# Patient Record
Sex: Female | Born: 1955
Health system: Southern US, Community
[De-identification: ages and names within clinical notes are randomized; demographics above are authoritative.]

## PROBLEM LIST (undated history)

## (undated) DIAGNOSIS — I10 Essential (primary) hypertension: Secondary | ICD-10-CM

## (undated) DIAGNOSIS — M722 Plantar fascial fibromatosis: Secondary | ICD-10-CM

## (undated) DIAGNOSIS — Z780 Asymptomatic menopausal state: Secondary | ICD-10-CM

## (undated) DIAGNOSIS — M810 Age-related osteoporosis without current pathological fracture: Secondary | ICD-10-CM

## (undated) DIAGNOSIS — E785 Hyperlipidemia, unspecified: Secondary | ICD-10-CM

## (undated) HISTORY — DX: Hyperlipidemia, unspecified: E78.5

## (undated) HISTORY — DX: Asymptomatic menopausal state: Z78.0

## (undated) HISTORY — DX: Age-related osteoporosis without current pathological fracture: M81.0

## (undated) HISTORY — PX: TUBAL LIGATION: SHX77

---

## 1994-10-19 HISTORY — PX: NASAL SEPTUM SURGERY: SHX37

## 2014-09-05 ENCOUNTER — Ambulatory Visit (INDEPENDENT_AMBULATORY_CARE_PROVIDER_SITE_OTHER): Payer: Federal, State, Local not specified - PPO | Admitting: Internal Medicine

## 2014-09-05 ENCOUNTER — Encounter: Payer: Self-pay | Admitting: Internal Medicine

## 2014-09-05 VITALS — BP 136/80 | HR 80 | Temp 98.4°F | Resp 16 | Ht 63.0 in | Wt 131.0 lb

## 2014-09-05 DIAGNOSIS — M810 Age-related osteoporosis without current pathological fracture: Secondary | ICD-10-CM | POA: Diagnosis not present

## 2014-09-05 DIAGNOSIS — IMO0002 Reserved for concepts with insufficient information to code with codable children: Secondary | ICD-10-CM | POA: Insufficient documentation

## 2014-09-05 DIAGNOSIS — E785 Hyperlipidemia, unspecified: Secondary | ICD-10-CM

## 2014-09-05 DIAGNOSIS — N941 Dyspareunia: Secondary | ICD-10-CM

## 2014-09-05 DIAGNOSIS — Z23 Encounter for immunization: Secondary | ICD-10-CM | POA: Diagnosis not present

## 2014-09-05 DIAGNOSIS — Z139 Encounter for screening, unspecified: Secondary | ICD-10-CM

## 2014-09-05 DIAGNOSIS — N951 Menopausal and female climacteric states: Secondary | ICD-10-CM | POA: Diagnosis not present

## 2014-09-05 NOTE — Progress Notes (Signed)
   Subjective:    Patient ID: Gloria Daugherty, female    DOB: 11-21-1955, 58 y.o.   MRN: [id number]  HPI  Gloria Daugherty is a new pt here for first visit.  In [place] for 2 years.  Former care in [place]  PMH of osteoporosis and menopause  She is concerned about vaginal dryness and painful intercourse due to dryness.  LMP 2008 not having vasomotor flushes now.      [facility]    Cholesterol  Minimally elevated.    Allergies  Allergen Reactions  . Codeine Other (See Comments)    Headache   Past Medical History  Diagnosis Date  . Osteoporosis   . Menopause    Past Surgical History  Procedure Laterality Date  . Tubal ligation    . Nasal septum surgery  1996   History   Social History  . Marital Status: Married    Spouse Name: N/A    Number of Children: N/A  . Years of Education: N/A   Occupational History  . Not on file.   Social History Main Topics  . Smoking status: Never Smoker   . Smokeless tobacco: Never Used  . Alcohol Use: 6.0 oz/week    10 Not specified per week  . Drug Use: No  . Sexual Activity:    Partners: Male   Other Topics Concern  . Not on file   Social History Narrative  . No narrative on file   Family History  Problem Relation Age of Onset  . Diabetes Mother   . Heart disease Father   . Diabetes Sister   . Diabetes Brother   . Heart disease Brother   . Diabetes Brother   . Heart disease Brother   . Diabetes Brother    Patient Active Problem List   Diagnosis Date Noted  . Osteoporosis 09/05/2014  . Vaginal dryness, menopausal 09/05/2014  . Dyspareunia 09/05/2014  . Hyperlipidemia 09/05/2014   No current outpatient prescriptions on file prior to visit.   No current facility-administered medications on file prior to visit.       Review of Systems See HPI    Objective:   Physical Exam Physical Exam  Nursing note and vitals reviewed.  Constitutional: She is oriented to person, place, and time. She appears well-developed and  well-nourished.  HENT:  Head: Normocephalic and atraumatic.  Cardiovascular: Normal rate and regular rhythm. Exam reveals no gallop and no friction rub.  No murmur heard.  Pulmonary/Chest: Breath sounds normal. She has no wheezes. She has no rales.  Neurological: She is alert and oriented to person, place, and time.  Skin: Skin is warm and dry.  Psychiatric: She has a normal mood and affect. Her behavior is normal.              Assessment & Plan:  Vaginal dryness/dyspaeurnia :  Counseled risk/benefit of HT she wishes to try non HT first  Samples Luvena given.   Given risk/benefit sheet   Hyperllipdemia  DASH diet   Osteoporosis   Will schedule DEXA  Will schedule mm dexa  Give Flu today

## 2014-09-05 NOTE — Patient Instructions (Signed)
Schedule CPE  Schedule mm and DEXA

## 2014-09-06 ENCOUNTER — Encounter: Payer: Self-pay | Admitting: *Deleted

## 2014-10-01 ENCOUNTER — Telehealth: Payer: Self-pay

## 2014-10-01 NOTE — Telephone Encounter (Signed)
Gloria CannyMargarita Daugherty [phone]  Gloria BellingMargarita called and she is coughing,congestion, sinus x 3 wks, she has been taking Clariton, nothing seems to help.

## 2014-10-01 NOTE — Telephone Encounter (Signed)
Offer her an appointment of to go to urgent care please

## 2014-10-02 ENCOUNTER — Ambulatory Visit (INDEPENDENT_AMBULATORY_CARE_PROVIDER_SITE_OTHER): Payer: Federal, State, Local not specified - PPO | Admitting: Internal Medicine

## 2014-10-02 ENCOUNTER — Encounter: Payer: Self-pay | Admitting: Internal Medicine

## 2014-10-02 VITALS — BP 131/81 | HR 70 | Temp 98.4°F | Resp 16 | Ht 63.0 in | Wt 130.0 lb

## 2014-10-02 DIAGNOSIS — J0100 Acute maxillary sinusitis, unspecified: Secondary | ICD-10-CM

## 2014-10-02 DIAGNOSIS — J029 Acute pharyngitis, unspecified: Secondary | ICD-10-CM

## 2014-10-02 DIAGNOSIS — R05 Cough: Secondary | ICD-10-CM | POA: Diagnosis not present

## 2014-10-02 DIAGNOSIS — R059 Cough, unspecified: Secondary | ICD-10-CM

## 2014-10-02 MED ORDER — BENZONATATE 100 MG PO CAPS: ORAL_CAPSULE | ORAL | Status: DC

## 2014-10-02 MED ORDER — AZITHROMYCIN 250 MG PO TABS: ORAL_TABLET | ORAL | Status: DC

## 2014-10-02 NOTE — Progress Notes (Signed)
Subjective:    Patient ID: Gloria CannyMargarita Dills, female    DOB: 1955/12/02, 58 y.o.   MRN: [id number]  HPI  11/18  Vaginal dryness/dyspaeurnia : Counseled risk/benefit of HT she wishes to try non HT first Samples Luvena given. Given risk/benefit sheet   Hyperllipdemia DASH diet   Osteoporosis Will schedule DEXA  Will schedule mm dexa Give Flu today      TODAY:  3 weeks cough white mucous  Began with sore throat .  No fever no chest pain no sob   Allergies  Allergen Reactions  . Codeine Other (See Comments)    Headache   Past Medical History  Diagnosis Date  . Osteoporosis   . Menopause    Past Surgical History  Procedure Laterality Date  . Tubal ligation    . Nasal septum surgery  1996   History   Social History  . Marital Status: Married    Spouse Name: N/A    Number of Children: N/A  . Years of Education: N/A   Occupational History  . Not on file.   Social History Main Topics  . Smoking status: Never Smoker   . Smokeless tobacco: Never Used  . Alcohol Use: 6.0 oz/week    10 Not specified per week  . Drug Use: No  . Sexual Activity:    Partners: Male   Other Topics Concern  . Not on file   Social History Narrative  . No narrative on file   Family History  Problem Relation Age of Onset  . Diabetes Mother   . Heart disease Father   . Diabetes Sister   . Diabetes Brother   . Heart disease Brother   . Diabetes Brother   . Heart disease Brother   . Diabetes Brother    Patient Active Problem List   Diagnosis Date Noted  . Osteoporosis 09/05/2014  . Vaginal dryness, menopausal 09/05/2014  . Dyspareunia 09/05/2014  . Hyperlipidemia 09/05/2014   Current Outpatient Prescriptions on File Prior to Visit  Medication Sig Dispense Refill  . Multiple Vitamin (MULTIVITAMIN) capsule Take 1 capsule by mouth daily.     No current facility-administered medications on file prior to visit.      Review of Systems See HPI    Objective:   Physical Exam Physical Exam  Constitutional: She is oriented to person, place, and time. She appears well-developed and well-nourished. She is cooperative.  HENT:  Head: Normocephalic and atraumatic.  Right Ear: A middle ear effusion is present.  Left Ear: A middle ear effusion is present.  Nose: Mucosal edema present. Right sinus exhibits maxillary sinus tenderness. Left sinus exhibits maxillary sinus tenderness.  Mouth/Throat: Posterior oropharyngeal erythema present.  Serous effusion bilaterally  Eyes: Conjunctivae and EOM are normal. Pupils are equal, round, and reactive to light.  Neck: Neck supple. Carotid bruit is not present. No mass present.  Cardiovascular: Regular rhythm, normal heart sounds, intact distal pulses and normal pulses. Exam reveals no gallop and no friction rub.  No murmur heard.  Pulmonary/Chest: Breath sounds normal. She has no wheezes. She has no rhonchi. She has no rales.  Neurological: She is alert and oriented to person, place, and time.  Skin: Skin is warm and dry. No abrasion, no bruising, no ecchymosis and no rash noted. No cyanosis. Nails show no clubbing.  Psychiatric: She has a normal mood and affect. Her speech is normal and behavior is normal.           Assessment & Plan:  Sinusitis   Z-pak  Pharyngitis see above   Cough tessalon perles q8h prn

## 2014-10-02 NOTE — Patient Instructions (Signed)
See me as needed 

## 2014-11-21 ENCOUNTER — Other Ambulatory Visit: Payer: Self-pay | Admitting: *Deleted

## 2014-11-21 ENCOUNTER — Other Ambulatory Visit: Payer: Self-pay | Admitting: Internal Medicine

## 2014-11-21 DIAGNOSIS — Z0000 Encounter for general adult medical examination without abnormal findings: Secondary | ICD-10-CM

## 2014-11-21 DIAGNOSIS — Z139 Encounter for screening, unspecified: Secondary | ICD-10-CM

## 2014-11-21 DIAGNOSIS — M810 Age-related osteoporosis without current pathological fracture: Secondary | ICD-10-CM

## 2014-11-27 ENCOUNTER — Ambulatory Visit: Payer: Self-pay | Admitting: Internal Medicine

## 2014-12-04 ENCOUNTER — Ambulatory Visit: Payer: Self-pay

## 2014-12-04 ENCOUNTER — Other Ambulatory Visit: Payer: Self-pay

## 2014-12-05 ENCOUNTER — Other Ambulatory Visit: Payer: Self-pay | Admitting: *Deleted

## 2014-12-05 DIAGNOSIS — Z0000 Encounter for general adult medical examination without abnormal findings: Secondary | ICD-10-CM

## 2014-12-11 LAB — TSH: TSH: 2.407 u[IU]/mL (ref 0.350–4.500)

## 2014-12-11 LAB — CBC WITH DIFFERENTIAL/PLATELET
Basophils Absolute: 0 10*3/uL (ref 0.0–0.1)
Basophils Relative: 1 % (ref 0–1)
Eosinophils Absolute: 0.1 10*3/uL (ref 0.0–0.7)
Eosinophils Relative: 2 % (ref 0–5)
HCT: 37.7 % (ref 36.0–46.0)
Hemoglobin: 12.4 g/dL (ref 12.0–15.0)
Lymphocytes Relative: 48 % — ABNORMAL HIGH (ref 12–46)
Lymphs Abs: 1.6 10*3/uL (ref 0.7–4.0)
MCH: 30.5 pg (ref 26.0–34.0)
MCHC: 32.9 g/dL (ref 30.0–36.0)
MCV: 92.6 fL (ref 78.0–100.0)
MPV: 10.3 fL (ref 8.6–12.4)
Monocytes Absolute: 0.2 10*3/uL (ref 0.1–1.0)
Monocytes Relative: 5 % (ref 3–12)
Neutro Abs: 1.5 10*3/uL — ABNORMAL LOW (ref 1.7–7.7)
Neutrophils Relative %: 44 % (ref 43–77)
Platelets: 222 10*3/uL (ref 150–400)
RBC: 4.07 MIL/uL (ref 3.87–5.11)
RDW: 14.1 % (ref 11.5–15.5)
WBC: 3.3 10*3/uL — ABNORMAL LOW (ref 4.0–10.5)

## 2014-12-11 LAB — COMPLETE METABOLIC PANEL WITH GFR
ALT: 15 U/L (ref 0–35)
AST: 19 U/L (ref 0–37)
Albumin: 4.3 g/dL (ref 3.5–5.2)
Alkaline Phosphatase: 61 U/L (ref 39–117)
BUN: 12 mg/dL (ref 6–23)
CO2: 27 mEq/L (ref 19–32)
Calcium: 9.5 mg/dL (ref 8.4–10.5)
Chloride: 106 mEq/L (ref 96–112)
Creat: 0.61 mg/dL (ref 0.50–1.10)
GFR, Est African American: >89 mL/min
GFR, Est Non African American: >89 mL/min
Glucose, Bld: 91 mg/dL (ref 70–99)
Potassium: 4 mEq/L (ref 3.5–5.3)
Sodium: 142 mEq/L (ref 135–145)
Total Bilirubin: 0.5 mg/dL (ref 0.2–1.2)
Total Protein: 6.3 g/dL (ref 6.0–8.3)

## 2014-12-11 LAB — LIPID PANEL
Cholesterol: 214 mg/dL — ABNORMAL HIGH (ref 0–200)
HDL: 37 mg/dL — ABNORMAL LOW (ref >=46)
LDL Cholesterol: 155 mg/dL — ABNORMAL HIGH (ref 0–99)
Total CHOL/HDL Ratio: 5.8 Ratio
Triglycerides: 110 mg/dL (ref <150)
VLDL: 22 mg/dL (ref 0–40)

## 2014-12-11 LAB — VITAMIN D 25 HYDROXY (VIT D DEFICIENCY, FRACTURES): Vit D, 25-Hydroxy: 45 ng/mL (ref 30–100)

## 2014-12-13 ENCOUNTER — Ambulatory Visit: Payer: Self-pay | Admitting: Internal Medicine

## 2014-12-14 ENCOUNTER — Ambulatory Visit: Admission: RE | Admit: 2014-12-14 | Payer: Federal, State, Local not specified - PPO | Source: Ambulatory Visit

## 2014-12-14 ENCOUNTER — Ambulatory Visit
Admission: RE | Admit: 2014-12-14 | Discharge: 2014-12-14 | Disposition: A | Discharge status: Home / Self Care | Payer: Federal, State, Local not specified - PPO | Source: Ambulatory Visit

## 2014-12-14 ENCOUNTER — Other Ambulatory Visit: Payer: Self-pay

## 2014-12-14 ENCOUNTER — Ambulatory Visit
Admission: RE | Admit: 2014-12-14 | Discharge: 2014-12-14 | Disposition: A | Discharge status: Home / Self Care | Payer: Federal, State, Local not specified - PPO | Source: Ambulatory Visit | Attending: Internal Medicine | Admitting: Internal Medicine

## 2014-12-14 DIAGNOSIS — Z1231 Encounter for screening mammogram for malignant neoplasm of breast: Secondary | ICD-10-CM

## 2014-12-14 DIAGNOSIS — M810 Age-related osteoporosis without current pathological fracture: Secondary | ICD-10-CM

## 2014-12-17 ENCOUNTER — Ambulatory Visit (INDEPENDENT_AMBULATORY_CARE_PROVIDER_SITE_OTHER): Payer: Federal, State, Local not specified - PPO | Admitting: Internal Medicine

## 2014-12-17 ENCOUNTER — Encounter: Payer: Self-pay | Admitting: *Deleted

## 2014-12-17 ENCOUNTER — Encounter: Payer: Self-pay | Admitting: Internal Medicine

## 2014-12-17 ENCOUNTER — Telehealth: Payer: Self-pay | Admitting: Internal Medicine

## 2014-12-17 ENCOUNTER — Telehealth: Payer: Self-pay | Admitting: *Deleted

## 2014-12-17 VITALS — BP 148/83 | HR 75 | Resp 16 | Ht 63.0 in | Wt 132.0 lb

## 2014-12-17 DIAGNOSIS — Z124 Encounter for screening for malignant neoplasm of cervix: Secondary | ICD-10-CM | POA: Diagnosis not present

## 2014-12-17 DIAGNOSIS — H9319 Tinnitus, unspecified ear: Secondary | ICD-10-CM

## 2014-12-17 DIAGNOSIS — Z0000 Encounter for general adult medical examination without abnormal findings: Secondary | ICD-10-CM

## 2014-12-17 DIAGNOSIS — Z1211 Encounter for screening for malignant neoplasm of colon: Secondary | ICD-10-CM | POA: Diagnosis not present

## 2014-12-17 DIAGNOSIS — E785 Hyperlipidemia, unspecified: Secondary | ICD-10-CM

## 2014-12-17 DIAGNOSIS — Z1151 Encounter for screening for human papillomavirus (HPV): Secondary | ICD-10-CM

## 2014-12-17 DIAGNOSIS — N951 Menopausal and female climacteric states: Secondary | ICD-10-CM | POA: Diagnosis not present

## 2014-12-17 DIAGNOSIS — M8580 Other specified disorders of bone density and structure, unspecified site: Secondary | ICD-10-CM | POA: Diagnosis not present

## 2014-12-17 LAB — POCT URINALYSIS DIPSTICK
Bilirubin, UA: NEGATIVE
Blood, UA: NEGATIVE
Glucose, UA: NEGATIVE
Ketones, UA: NEGATIVE
Leukocytes, UA: NEGATIVE
Nitrite, UA: NEGATIVE
Protein, UA: NEGATIVE
Spec Grav, UA: 1.015
Urobilinogen, UA: NEGATIVE
pH, UA: 6.5

## 2014-12-17 LAB — HEMOCCULT GUIAC POC 1CARD (OFFICE)
Card #1 Date: 2292016
Fecal Occult Blood, POC: NEGATIVE

## 2014-12-17 NOTE — Telephone Encounter (Signed)
Call pt with appt to [facility] for tinnitus    Order already in chart

## 2014-12-17 NOTE — Progress Notes (Signed)
Subjective:    Patient ID: Gloria Daugherty, female    DOB: [birth year], 59 y.o.   MRN: [id number]  HPI 08/2014 note Assessment & Plan:  Vaginal dryness/dyspaeurnia : Counseled risk/benefit of HT she wishes to try non HT first Samples Luvena given. Given risk/benefit sheet   Hyperllipdemia DASH diet   Osteoporosis Will schedule DEXA  Will schedule mm dexa Give Flu today        TODAY  Gloria Daugherty is here for CPE  HM needs pap  Today,  Dexa and mm done.   Osteoporosis  Pt reports was on Actonel for 2 years while living in [place].  She stopped med herself.    Situational depression  Gloria Daugherty is tearful today as nephew in [place] who she was very close to completed suicide  in December.  Hyperlipidemia:  See labs  [name] repeatedly states she does not want medication.  She wants to try fish oil and DASH diet and exercise.  Not eating well since news of nephew's suicide.       Allergies  Allergen Reactions  . Codeine Other (See Comments)    Headache   Past Medical History  Diagnosis Date  . Osteoporosis   . Menopause    Past Surgical History  Procedure Laterality Date  . Tubal ligation    . Nasal septum surgery  1996   History   Social History  . Marital Status: Married    Spouse Name: N/A  . Number of Children: N/A  . Years of Education: N/A   Occupational History  . Not on file.   Social History Main Topics  . Smoking status: Never Smoker   . Smokeless tobacco: Never Used  . Alcohol Use: 6.0 oz/week    10 Standard drinks or equivalent per week  . Drug Use: No  . Sexual Activity:    Partners: Male    Birth Control/ Protection: Post-menopausal   Other Topics Concern  . Not on file   Social History Narrative   Family History  Problem Relation Age of Onset  . Diabetes Mother   . Heart disease Father   . Diabetes Sister   . Diabetes Brother   . Heart disease Brother   . Diabetes Brother   . Heart disease Brother   . Diabetes Brother     Patient Active Problem List   Diagnosis Date Noted  . Osteoporosis 09/05/2014  . Vaginal dryness, menopausal 09/05/2014  . Dyspareunia 09/05/2014  . Hyperlipidemia 09/05/2014   Current Outpatient Prescriptions on File Prior to Visit  Medication Sig Dispense Refill  . azithromycin (ZITHROMAX) 250 MG tablet Take as directed 6 tablet 0  . benzonatate (TESSALON) 100 MG capsule Take one q8h prn cough 20 capsule 1  . loratadine (CLARITIN) 10 MG tablet Take 10 mg by mouth daily.    . Multiple Vitamin (MULTIVITAMIN) capsule Take 1 capsule by mouth daily.    . Pseudoephedrine-Naproxen Na (ALEVE-D SINUS & COLD) 120-220 MG TB12 Take by mouth.     No current facility-administered medications on file prior to visit.       Review of Systems  Respiratory: Negative for cough, chest tightness, shortness of breath and wheezing.   Cardiovascular: Negative for chest pain, palpitations and leg swelling.  All other systems reviewed and are negative.      Objective:   Physical Exam  Physical Exam  Vital signs and nursing note reviewed  Constitutional: She is oriented to person, place, and time. She appears well-developed and well-nourished. She  is cooperative.  HENT:  Head: Normocephalic and atraumatic.  Right Ear: Tympanic membrane normal.  Left Ear: Tympanic membrane normal.  Nose: Nose normal.  Mouth/Throat: Oropharynx is clear and moist and mucous membranes are normal. No oropharyngeal exudate or posterior oropharyngeal erythema.  Eyes: Conjunctivae and EOM are normal. Pupils are equal, round, and reactive to light.  Neck: Neck supple. No JVD present. Carotid bruit is not present. No mass and no thyromegaly present.  Cardiovascular: Regular rhythm, normal heart sounds, intact distal pulses and normal pulses.  Exam reveals no gallop and no friction rub.   No murmur heard. Pulses:      Dorsalis pedis pulses are 2+ on the right side, and 2+ on the left side.  Pulmonary/Chest: Breath  sounds normal. She has no wheezes. She has no rhonchi. She has no rales. Right breast exhibits no mass, no nipple discharge and no skin change. Left breast exhibits no mass, no nipple discharge and no skin change.  Abdominal: Soft. Bowel sounds are normal. She exhibits no distension and no mass. There is no hepatosplenomegaly. There is no tenderness. There is no CVA tenderness.  Genitourinary: Rectum normal, vagina normal and uterus normal. Rectal exam shows no mass. Guaiac negative stool. No labial fusion. There is no lesion on the right labia. There is no lesion on the left labia. Cervix exhibits no motion tenderness. Right adnexum displays no mass, no tenderness and no fullness. Left adnexum displays no mass, no tenderness and no fullness. No erythema around the vagina.  Musculoskeletal:       No active synovitis to any joint.    Lymphadenopathy:       Right cervical: No superficial cervical adenopathy present.      Left cervical: No superficial cervical adenopathy present.       Right axillary: No pectoral and no lateral adenopathy present.       Left axillary: No pectoral and no lateral adenopathy present.      Right: No inguinal adenopathy present.       Left: No inguinal adenopathy present.  Neurological: She is alert and oriented to person, place, and time. She has normal strength and normal reflexes. No cranial nerve deficit or sensory deficit. She displays a negative Romberg sign. Coordination and gait normal.  Skin: Skin is warm and dry. No abrasion, no bruising, no ecchymosis and no rash noted. No cyanosis. Nails show no clubbing.  Psychiatric: She has a normal mood and affect. Her speech is normal and behavior is normal.          Assessment & Plan:   HM: pap today  She is a non-smoker  Declines vaccines due to insurance  Hyperlipidemia  She declines RX meds  Wishes to try DASH diet and fish oil  Tinnitus  Will refer to ENT  Osteopenia  caclicum and vitamin D  Grief : pt  does not wish therapist now.  States she is slowly getting better    See me as needed      Assessment & Plan:

## 2014-12-17 NOTE — Telephone Encounter (Signed)
Patient has an appointment with [facility] on 12/23/14 @ 10, patient is aware-eh

## 2014-12-17 NOTE — Telephone Encounter (Signed)
[facility] and gave her the number for the support group she could contact through [facility]. The number for the [employer] is [phone]. -eh

## 2014-12-18 LAB — CYTOLOGY - PAP

## 2014-12-31 ENCOUNTER — Encounter: Payer: Self-pay | Admitting: *Deleted

## 2014-12-31 ENCOUNTER — Telehealth: Payer: Self-pay | Admitting: Internal Medicine

## 2014-12-31 NOTE — Telephone Encounter (Signed)
Gloria BellingMargarita is aware of her DEXA scan results. She was advised on her Calcium and Vitamin D dosing -eh

## 2014-12-31 NOTE — Telephone Encounter (Signed)
Call pt and let her know that I have received her bone density results.  She does not have osteoporosis,  She has early bone thinning  (osteopenia). Advise her to take calcium 1200-1600 mg and vitamin D [phone] units daily   Thanks

## 2015-03-11 ENCOUNTER — Ambulatory Visit: Payer: Federal, State, Local not specified - PPO | Admitting: Internal Medicine

## 2016-07-15 ENCOUNTER — Other Ambulatory Visit: Payer: Self-pay | Admitting: Internal Medicine

## 2016-07-15 DIAGNOSIS — Z1231 Encounter for screening mammogram for malignant neoplasm of breast: Secondary | ICD-10-CM

## 2016-07-24 ENCOUNTER — Ambulatory Visit: Payer: Federal, State, Local not specified - PPO

## 2016-07-31 ENCOUNTER — Ambulatory Visit
Admission: RE | Admit: 2016-07-31 | Discharge: 2016-07-31 | Disposition: A | Discharge status: Home / Self Care | Payer: Federal, State, Local not specified - PPO | Source: Ambulatory Visit | Attending: Internal Medicine | Admitting: Internal Medicine

## 2016-07-31 DIAGNOSIS — Z1231 Encounter for screening mammogram for malignant neoplasm of breast: Secondary | ICD-10-CM

## 2017-07-12 DIAGNOSIS — Z23 Encounter for immunization: Secondary | ICD-10-CM | POA: Diagnosis not present

## 2017-07-12 DIAGNOSIS — J301 Allergic rhinitis due to pollen: Secondary | ICD-10-CM | POA: Diagnosis not present

## 2017-07-12 DIAGNOSIS — E7800 Pure hypercholesterolemia, unspecified: Secondary | ICD-10-CM | POA: Diagnosis not present

## 2017-07-12 DIAGNOSIS — Z0000 Encounter for general adult medical examination without abnormal findings: Secondary | ICD-10-CM | POA: Diagnosis not present

## 2017-07-12 DIAGNOSIS — M8588 Other specified disorders of bone density and structure, other site: Secondary | ICD-10-CM | POA: Diagnosis not present

## 2017-07-19 ENCOUNTER — Other Ambulatory Visit: Payer: Self-pay | Admitting: Internal Medicine

## 2017-07-19 DIAGNOSIS — Z1231 Encounter for screening mammogram for malignant neoplasm of breast: Secondary | ICD-10-CM

## 2017-08-02 ENCOUNTER — Ambulatory Visit: Payer: Federal, State, Local not specified - PPO

## 2017-08-13 ENCOUNTER — Ambulatory Visit
Admission: RE | Admit: 2017-08-13 | Discharge: 2017-08-13 | Disposition: A | Discharge status: Home / Self Care | Payer: Federal, State, Local not specified - PPO | Source: Ambulatory Visit | Attending: Internal Medicine | Admitting: Internal Medicine

## 2017-08-13 DIAGNOSIS — Z1231 Encounter for screening mammogram for malignant neoplasm of breast: Secondary | ICD-10-CM | POA: Diagnosis not present

## 2017-12-02 ENCOUNTER — Ambulatory Visit
Admission: RE | Admit: 2017-12-02 | Discharge: 2017-12-02 | Disposition: A | Discharge status: Home / Self Care | Payer: Federal, State, Local not specified - PPO | Source: Ambulatory Visit | Attending: Internal Medicine | Admitting: Internal Medicine

## 2017-12-02 ENCOUNTER — Other Ambulatory Visit: Payer: Self-pay | Admitting: Internal Medicine

## 2017-12-02 DIAGNOSIS — M79671 Pain in right foot: Secondary | ICD-10-CM

## 2017-12-16 ENCOUNTER — Ambulatory Visit: Payer: Self-pay | Admitting: Podiatry

## 2017-12-16 DIAGNOSIS — Z01818 Encounter for other preprocedural examination: Secondary | ICD-10-CM | POA: Diagnosis not present

## 2017-12-16 DIAGNOSIS — Z1211 Encounter for screening for malignant neoplasm of colon: Secondary | ICD-10-CM | POA: Diagnosis not present

## 2017-12-21 ENCOUNTER — Telehealth: Payer: Self-pay | Admitting: Sports Medicine

## 2017-12-21 ENCOUNTER — Ambulatory Visit: Payer: Federal, State, Local not specified - PPO

## 2017-12-21 ENCOUNTER — Encounter: Payer: Self-pay | Admitting: Sports Medicine

## 2017-12-21 ENCOUNTER — Ambulatory Visit: Payer: Federal, State, Local not specified - PPO | Admitting: Sports Medicine

## 2017-12-21 VITALS — BP 138/91 | HR 77 | Resp 16

## 2017-12-21 DIAGNOSIS — M722 Plantar fascial fibromatosis: Secondary | ICD-10-CM

## 2017-12-21 DIAGNOSIS — M79671 Pain in right foot: Secondary | ICD-10-CM | POA: Diagnosis not present

## 2017-12-21 MED ORDER — DICLOFENAC SODIUM 75 MG PO TBEC: 75.0000 mg | DELAYED_RELEASE_TABLET | Freq: Two times a day (BID) | ORAL | 0 refills | Status: DC

## 2017-12-21 NOTE — Patient Instructions (Signed)

## 2017-12-21 NOTE — Progress Notes (Signed)
   Subjective:    Patient ID: Gloria CannyMargarita Fors, female    DOB: [birth year], 62 y.o.   MRN: [id number]  HPI    Review of Systems  All other systems reviewed and are negative.      Objective:   Physical Exam        Assessment & Plan:

## 2017-12-21 NOTE — Progress Notes (Signed)
Subjective: Gloria Daugherty is a 62 y.o. female patient presents to office with complaint of mild discomforting heel pain on the right. Patient admits to post static dyskinesia for 2 months in duration. Patient has treated this problem with gentle stretching and rest at end of day with no relief. Reports pain is not bad or bothersome during day. At most dull ache 2/10 at end of day. Denies recent injury but admits to sting ray injury 15 years ago. Was told by PCP had heel spur and had xrays done. Denies any other pedal complaints.   Review of Systems  Musculoskeletal:       Right heel pain  All other systems reviewed and are negative.   Patient Active Problem List   Diagnosis Date Noted  . Osteopenia  Dexa done 2016  12/17/2014  . Osteoporosis 09/05/2014  . Vaginal dryness, menopausal 09/05/2014  . Dyspareunia 09/05/2014  . Hyperlipidemia 09/05/2014    Current Outpatient Medications on File Prior to Visit  Medication Sig Dispense Refill  . CALCIUM PO Take 1 tablet by mouth daily.    . Cholecalciferol (VITAMIN D PO) Take 1 tablet by mouth daily.    Marland Kitchen loratadine (CLARITIN) 10 MG tablet Take 10 mg by mouth daily.    . Multiple Vitamin (MULTIVITAMIN) capsule Take 1 capsule by mouth daily. Centrum    . Omega-3 Fatty Acids (FISH OIL PO) Take 1 tablet by mouth daily.     No current facility-administered medications on file prior to visit.     Allergies  Allergen Reactions  . Codeine Other (See Comments)    Headache    Objective: Physical Exam General: The patient is alert and oriented x3 in no acute distress.  Dermatology: Skin is warm, dry and supple bilateral lower extremities. Nails 1-10 are normal. There is no erythema, edema, no eccymosis, no open lesions present. Integument is otherwise unremarkable.  Vascular: Dorsalis Pedis pulse and Posterior Tibial pulse are 2/4 bilateral. Capillary fill time is immediate to all digits.  Neurological: Grossly intact to light touch with  an achilles reflex of +2/5 and a  negative Tinel's sign bilateral.  Musculoskeletal: Tenderness to palpation at the medial calcaneal tubercale and through the insertion of the plantar fascia on the right foot. No pain with compression of calcaneus bilateral. No pain with tuning fork to calcaneus bilateral. No pain with calf compression bilateral. There is decreased Ankle joint range of motion bilateral. All other joints range of motion within normal limits bilateral. Strength 5/5 in all groups bilateral.   Xrays 12-02-17 reviewed consistent with heel spur, no other acute findings.    Assessment and Plan: Problem List Items Addressed This Visit    None    Visit Diagnoses    Plantar fasciitis of right foot    -  Primary   Pain of right heel          -Complete examination performed.  -Xrays reviewed -Discussed with patient in detail the condition of plantar fasciitis, how this occurs and general treatment options. Explained both conservative and surgical treatments.  -No injection because pain is minor  -Rx Diclofenac  -Recommended good supportive shoes and advised use of OTC insert. Explained to patient that if these orthoses work well, we will continue with these. If these do not improveher condition and  pain, we will consider custom molded orthoses. - Explained in detail the use of the fascial brace for right which was dispensed at today's visit. -Explained and dispensed to patient daily stretching exercises. -  Recommend patient to ice affected area 1-2x daily. -Patient to return to office in 4 weeks for follow up or sooner if problems or questions arise.  Asencion Islamitorya Jameon Deller, DPM

## 2017-12-22 NOTE — Telephone Encounter (Signed)
I saw Dr. Marylene LandStover today and she diagnosed me with plantar fasciitis. She prescribed me an antiinflammatory and I wanted to know if I can consume some alcohol with it? The instructions say to talk to your doctor before consuming alcohol. I just want to know if I can. My call back number is [phone]. Thank you.

## 2017-12-22 NOTE — Telephone Encounter (Signed)
I informed pt Gloria Daugherty stated it was not a good idea to have alcohol with any medication, but diclofenac could cause sleepiness and on occasion stomach up set, both of which were effects alcohol could increase. Pt states understanding.

## 2018-01-02 ENCOUNTER — Other Ambulatory Visit: Payer: Self-pay | Admitting: Sports Medicine

## 2018-01-11 DIAGNOSIS — E7800 Pure hypercholesterolemia, unspecified: Secondary | ICD-10-CM | POA: Diagnosis not present

## 2018-01-25 ENCOUNTER — Ambulatory Visit: Payer: Federal, State, Local not specified - PPO | Admitting: Sports Medicine

## 2018-01-25 ENCOUNTER — Encounter: Payer: Self-pay | Admitting: Sports Medicine

## 2018-01-25 DIAGNOSIS — M722 Plantar fascial fibromatosis: Secondary | ICD-10-CM

## 2018-01-25 DIAGNOSIS — M79671 Pain in right foot: Secondary | ICD-10-CM

## 2018-01-25 MED ORDER — TRIAMCINOLONE ACETONIDE 10 MG/ML IJ SUSP: 10.0000 mg | Freq: Once | INTRAMUSCULAR | Status: AC

## 2018-01-25 NOTE — Progress Notes (Signed)
Subjective: Gloria CannyMargarita Daugherty is a 62 y.o. female returns to office for follow up evaluation right heel pain. Reports that it feels slightly better with brace but wants injection to see if she can get more relief. Patient denies any recent changes in medications or new problems since last visit.   Patient Active Problem List   Diagnosis Date Noted  . Osteopenia  Dexa done 2016  12/17/2014  . Osteoporosis 09/05/2014  . Vaginal dryness, menopausal 09/05/2014  . Dyspareunia 09/05/2014  . Hyperlipidemia 09/05/2014    Current Outpatient Medications on File Prior to Visit  Medication Sig Dispense Refill  . CALCIUM PO Take 1 tablet by mouth daily.    . Cholecalciferol (VITAMIN D PO) Take 1 tablet by mouth daily.    . diclofenac (VOLTAREN) 75 MG EC tablet TAKE 1 TABLET BY MOUTH TWICE DAILY 30 tablet 0  . loratadine (CLARITIN) 10 MG tablet Take 10 mg by mouth daily.    . Multiple Vitamin (MULTIVITAMIN) capsule Take 1 capsule by mouth daily. Centrum    . Omega-3 Fatty Acids (FISH OIL PO) Take 1 tablet by mouth daily.     No current facility-administered medications on file prior to visit.     Allergies  Allergen Reactions  . Codeine Other (See Comments)    Headache    Objective:   General:  Alert and oriented x 3, in no acute distress  Dermatology: Skin is warm, dry, and supple bilateral. Nails are within normal limits. There is no lower extremity erythema, no eccymosis, no open lesions present bilateral.   Vascular: Dorsalis Pedis and Posterior Tibial pedal pulses are 2/4 bilateral. + hair growth noted bilateral. Capillary Fill Time is 3 seconds in all digits. No varicosities, No edema bilateral lower extremities.   Neurological: Sensation grossly intact to light touch with an achilles reflex of +2 and a negative Tinel's sign bilateral. Vibratory, sharp/dull, Semmes Weinstein Monofilament within normal limits.   Musculoskeletal: There is mild tenderness to palpation at the medial  calcaneal tubercale and through the insertion of the plantar fascia on the right foot. No pain with compression to calcaneus or application of tuning fork. There is decreased Ankle joint range of motion bilateral. All other joints range of motion  within normal limits bilateral. Strength 5/5 bilateral.   Assessment and Plan: Problem List Items Addressed This Visit    None    Visit Diagnoses    Plantar fasciitis of right foot    -  Primary   Relevant Medications   triamcinolone acetonide (KENALOG) 10 MG/ML injection 10 mg (Start on 01/25/2018  2:45 PM)   Pain of right heel       Relevant Medications   triamcinolone acetonide (KENALOG) 10 MG/ML injection 10 mg (Start on 01/25/2018  2:45 PM)      -Complete examination performed.  -Previous x-rays reviewed. -Discussed with patient in detail the condition of plantar fasciitis, how this  occurs related to the foot type of the patient and general treatment options. - Patient opted fo injection today; After oral consent and aseptic prep, injected a mixture containing 1 ml of 1%plain lidocaine, 1 ml 0.5% plain marcaine, 0.5 ml of kenalog 10 and 0.5 ml of dexmethasone phosphate to right heel at area of most pain/trigger point injection. -Continue with stretching, icing, good supportive shoes, inserts daily. -Discussed long term care and reocurrence; will closely monitor; if fails to improve will consider other treatment modalities.  -Patient to return to office in 4-6 weeks for follow up or  sooner if problems or questions arise.  Landis Martins, DPM

## 2018-03-08 ENCOUNTER — Ambulatory Visit: Payer: Federal, State, Local not specified - PPO | Admitting: Sports Medicine

## 2018-03-22 DIAGNOSIS — R197 Diarrhea, unspecified: Secondary | ICD-10-CM | POA: Diagnosis not present

## 2018-03-25 ENCOUNTER — Other Ambulatory Visit: Payer: Self-pay | Admitting: Internal Medicine

## 2018-03-25 DIAGNOSIS — G44099 Other trigeminal autonomic cephalgias (TAC), not intractable: Secondary | ICD-10-CM

## 2018-03-25 DIAGNOSIS — H547 Unspecified visual loss: Secondary | ICD-10-CM | POA: Diagnosis not present

## 2018-03-25 DIAGNOSIS — R197 Diarrhea, unspecified: Secondary | ICD-10-CM | POA: Diagnosis not present

## 2018-03-25 DIAGNOSIS — G4489 Other headache syndrome: Secondary | ICD-10-CM | POA: Diagnosis not present

## 2018-03-25 DIAGNOSIS — Z8249 Family history of ischemic heart disease and other diseases of the circulatory system: Secondary | ICD-10-CM | POA: Diagnosis not present

## 2018-04-05 ENCOUNTER — Ambulatory Visit
Admission: RE | Admit: 2018-04-05 | Discharge: 2018-04-05 | Disposition: A | Discharge status: Home / Self Care | Payer: Federal, State, Local not specified - PPO | Source: Ambulatory Visit | Attending: Internal Medicine | Admitting: Internal Medicine

## 2018-04-05 ENCOUNTER — Inpatient Hospital Stay: Admission: RE | Admit: 2018-04-05 | Payer: Federal, State, Local not specified - PPO | Source: Ambulatory Visit

## 2018-04-05 DIAGNOSIS — H547 Unspecified visual loss: Secondary | ICD-10-CM

## 2018-04-05 DIAGNOSIS — Z8249 Family history of ischemic heart disease and other diseases of the circulatory system: Secondary | ICD-10-CM

## 2018-04-05 DIAGNOSIS — R51 Headache: Secondary | ICD-10-CM | POA: Diagnosis not present

## 2018-04-05 DIAGNOSIS — G44099 Other trigeminal autonomic cephalgias (TAC), not intractable: Secondary | ICD-10-CM

## 2018-04-05 MED ORDER — IOPAMIDOL (ISOVUE-370) INJECTION 76%
75.0000 mL | Freq: Once | INTRAVENOUS | Status: AC | PRN
  Administered 2018-04-05: 75 mL via INTRAVENOUS

## 2018-04-06 DIAGNOSIS — A09 Infectious gastroenteritis and colitis, unspecified: Secondary | ICD-10-CM | POA: Diagnosis not present

## 2018-04-06 DIAGNOSIS — H547 Unspecified visual loss: Secondary | ICD-10-CM | POA: Diagnosis not present

## 2018-05-13 ENCOUNTER — Encounter (HOSPITAL_COMMUNITY): Payer: Self-pay | Admitting: Emergency Medicine

## 2018-05-13 ENCOUNTER — Other Ambulatory Visit: Payer: Self-pay

## 2018-05-13 ENCOUNTER — Ambulatory Visit (HOSPITAL_COMMUNITY)
Admission: EM | Admit: 2018-05-13 | Discharge: 2018-05-13 | Disposition: A | Discharge status: Home / Self Care | Payer: Federal, State, Local not specified - PPO | Attending: Family Medicine | Admitting: Family Medicine

## 2018-05-13 DIAGNOSIS — J069 Acute upper respiratory infection, unspecified: Secondary | ICD-10-CM | POA: Diagnosis not present

## 2018-05-13 HISTORY — DX: Plantar fascial fibromatosis: M72.2

## 2018-05-13 MED ORDER — BENZONATATE 100 MG PO CAPS: 100.0000 mg | ORAL_CAPSULE | Freq: Three times a day (TID) | ORAL | 0 refills | Status: DC

## 2018-05-13 MED ORDER — FLUTICASONE PROPIONATE 50 MCG/ACT NA SUSP: 2.0000 | Freq: Every day | NASAL | 2 refills | Status: AC

## 2018-05-13 MED ORDER — CETIRIZINE-PSEUDOEPHEDRINE ER 5-120 MG PO TB12: 1.0000 | ORAL_TABLET | Freq: Every day | ORAL | 0 refills | Status: DC

## 2018-05-13 NOTE — ED Provider Notes (Signed)
[facility]    CSN: [id number] Arrival date & time: 05/13/18  1049     History   Chief Complaint Chief Complaint  Patient presents with  . Nasal Congestion    HPI Gloria Daugherty is a 62 y.o. female.   Patient is a healthy 62 year old female that presents with 6 days of nasal congestion, cough, body aches.  She reports that the symptoms have been constant but not worsening.  The symptoms started after returning from [place].  The worst symptom is the cough and the inability to sleep at night.  She has a lot of congestion in her nasal area with slight facial tenderness.  Denies any ear pain, sore throat, fever.  She denies any chest pain, shortness of breath, calf pain.  No history of PE, DVT.  She  does not smoke.   ROS per HPI      Past Medical History:  Diagnosis Date  . Menopause   . Osteoporosis   . Plantar fasciitis     Patient Active Problem List   Diagnosis Date Noted  . Osteopenia  Dexa done 2016  12/17/2014  . Osteoporosis 09/05/2014  . Vaginal dryness, menopausal 09/05/2014  . Dyspareunia 09/05/2014  . Hyperlipidemia 09/05/2014    Past Surgical History:  Procedure Laterality Date  . NASAL SEPTUM SURGERY  1996  . TUBAL LIGATION      OB History   None      Home Medications    Prior to Admission medications   Medication Sig Start Date End Date Taking? Authorizing Provider  CALCIUM PO Take 1 tablet by mouth daily.   Yes [provider]  Cholecalciferol (VITAMIN D PO) Take 1 tablet by mouth daily.   Yes [provider]  Multiple Vitamin (MULTIVITAMIN) capsule Take 1 capsule by mouth daily. Centrum   Yes [provider]  Omega-3 Fatty Acids (FISH OIL PO) Take 1 tablet by mouth daily.   Yes [provider]  benzonatate (TESSALON) 100 MG capsule Take 1 capsule (100 mg total) by mouth every 8 (eight) hours. 05/13/18   Rhodes Calvert, Gloris Manchester A, NP  cetirizine-pseudoephedrine (ZYRTEC-D) 5-120 MG tablet Take 1 tablet by  mouth daily. 05/13/18   Dahlia Byes A, NP  diclofenac (VOLTAREN) 75 MG EC tablet TAKE 1 TABLET BY MOUTH TWICE DAILY 01/03/18   Asencion Islam, DPM  fluticasone (FLONASE) 50 MCG/ACT nasal spray Place 2 sprays into both nostrils daily. 05/13/18   Dahlia Byes A, NP  loratadine (CLARITIN) 10 MG tablet Take 10 mg by mouth daily.    [provider]    Family History Family History  Problem Relation Age of Onset  . Diabetes Mother   . Heart disease Father   . Diabetes Sister   . Diabetes Brother   . Heart disease Brother   . Diabetes Brother   . Heart disease Brother   . Diabetes Brother     Social History Social History   Tobacco Use  . Smoking status: Never Smoker  . Smokeless tobacco: Never Used  Substance Use Topics  . Alcohol use: Yes    Alcohol/week: 6.0 oz    Types: 10 Standard drinks or equivalent per week  . Drug use: No     Allergies   Codeine   Review of Systems Review of Systems   Physical Exam Triage Vital Signs ED Triage Vitals  Enc Vitals Group     BP 05/13/18 1131 (!) 160/98     Pulse Rate 05/13/18 1131 80  Resp 05/13/18 1131 18     Temp 05/13/18 1131 98.3 F (36.8 C)     Temp Source 05/13/18 1131 Oral     SpO2 05/13/18 1131 100 %     Weight 05/13/18 1144 134 lb (60.8 kg)     Height 05/13/18 1144 5\' 3"  (1.6 m)     Head Circumference --      Peak Flow --      Pain Score 05/13/18 1143 0     Pain Loc --      Pain Edu? --      Excl. in GC? --    No data found.  Updated Vital Signs BP (!) 160/98 (BP Location: Right Arm)   Pulse 80   Temp 98.3 F (36.8 C) (Oral)   Resp 18   Ht 5\' 3"  (1.6 m)   Wt 134 lb (60.8 kg)   SpO2 100%   BMI 23.74 kg/m   Visual Acuity Right Eye Distance:   Left Eye Distance:   Bilateral Distance:    Right Eye Near:   Left Eye Near:    Bilateral Near:     Physical Exam  Constitutional: She is oriented to person, place, and time. She appears well-developed and well-nourished.  HENT:  Head:  Normocephalic and atraumatic.  Right Ear: External ear normal.  Left Ear: External ear normal.  Nose: Nose normal.  Mouth/Throat: Oropharynx is clear and moist. No oropharyngeal exudate.  Swollen right nasal turbinate.  Mild tenderness to palpation of frontal and maxillary sinuses.  Bilateral TMs normal.   Eyes: Conjunctivae are normal.  Neck: Normal range of motion.  Cardiovascular: Normal rate and regular rhythm.  Pulmonary/Chest: Effort normal and breath sounds normal.  No adventitious breath sounds.  Lymphadenopathy:    She has no cervical adenopathy.  Neurological: She is alert and oriented to person, place, and time.  Skin: Skin is warm and dry. Capillary refill takes less than 2 seconds.  Psychiatric: She has a normal mood and affect.  Nursing note and vitals reviewed.    UC Treatments / Results  Labs (all labs ordered are listed, but only abnormal results are displayed) Labs Reviewed - No data to display  EKG None  Radiology No results found.  Procedures Procedures (including critical care time)  Medications Ordered in UC Medications - No data to display  Initial Impression / Assessment and Plan / UC Course  I have reviewed the triage vital signs and the nursing notes.  Pertinent labs & imaging results that were available during my care of the patient were reviewed by me and considered in my medical decision making (see chart for details).    Most likely viral upper respiratory infection.  No concern for PE, DVT.  Will give symptomatic treatment for her symptoms.  No antibiotic coverage needed at this time.  Told that if her symptoms do not improve in the next week to please follow back up.  Patient agreeable to plan.  Final Clinical Impressions(s) / UC Diagnoses   Final diagnoses:  Upper respiratory tract infection, unspecified type     Discharge Instructions     It was nice meeting you!!  I believe you have a viral upper respiratory infection. It could  take 7 to 10 days for the symptoms to decrease.  We will treat your  cough with Tessalon Perles. Zyrtec D for congestion and Flonase for allergy symptoms.  Make sure you are staying hydrated and monitor for worsening symptoms. Warm tea with honey can  also help with the cough.  Follow-up with your PCP if not getting any better.    ED Prescriptions    Medication Sig Dispense Auth. Provider   cetirizine-pseudoephedrine (ZYRTEC-D) 5-120 MG tablet Take 1 tablet by mouth daily. 30 tablet Jestina Stephani A, NP   fluticasone (FLONASE) 50 MCG/ACT nasal spray Place 2 sprays into both nostrils daily. 16 g Candita Borenstein A, NP   benzonatate (TESSALON) 100 MG capsule Take 1 capsule (100 mg total) by mouth every 8 (eight) hours. 21 capsule Dahlia ByesBast, Dequavion Follette A, NP     Controlled Substance Prescriptions [place] Controlled Substance Registry consulted? Not Applicable   Janace ArisBast, Ely Ballen A, NP 05/13/18 1231

## 2018-05-13 NOTE — Discharge Instructions (Addendum)
It was nice meeting you!!  I believe you have a viral upper respiratory infection. It could take 7 to 10 days for the symptoms to decrease.  We will treat your  cough with Tessalon Perles. Zyrtec D for congestion and Flonase for allergy symptoms.  Make sure you are staying hydrated and monitor for worsening symptoms. Warm tea with honey can also help with the cough.  Follow-up with your PCP if not getting any better.

## 2018-05-13 NOTE — ED Triage Notes (Signed)
Patient states she has congestion and cough, sinus drainage x 6 days

## 2018-06-27 DIAGNOSIS — K648 Other hemorrhoids: Secondary | ICD-10-CM | POA: Diagnosis not present

## 2018-06-27 DIAGNOSIS — K5730 Diverticulosis of large intestine without perforation or abscess without bleeding: Secondary | ICD-10-CM | POA: Diagnosis not present

## 2018-06-27 DIAGNOSIS — Z1211 Encounter for screening for malignant neoplasm of colon: Secondary | ICD-10-CM | POA: Diagnosis not present

## 2018-08-01 DIAGNOSIS — Z23 Encounter for immunization: Secondary | ICD-10-CM | POA: Diagnosis not present

## 2018-08-01 DIAGNOSIS — M8588 Other specified disorders of bone density and structure, other site: Secondary | ICD-10-CM | POA: Diagnosis not present

## 2018-08-01 DIAGNOSIS — E7800 Pure hypercholesterolemia, unspecified: Secondary | ICD-10-CM | POA: Diagnosis not present

## 2018-08-01 DIAGNOSIS — J301 Allergic rhinitis due to pollen: Secondary | ICD-10-CM | POA: Diagnosis not present

## 2018-08-01 DIAGNOSIS — Z0000 Encounter for general adult medical examination without abnormal findings: Secondary | ICD-10-CM | POA: Diagnosis not present

## 2018-09-06 ENCOUNTER — Other Ambulatory Visit: Payer: Self-pay | Admitting: Internal Medicine

## 2018-09-06 DIAGNOSIS — Z1231 Encounter for screening mammogram for malignant neoplasm of breast: Secondary | ICD-10-CM

## 2018-10-04 ENCOUNTER — Other Ambulatory Visit (HOSPITAL_COMMUNITY)
Admission: RE | Admit: 2018-10-04 | Discharge: 2018-10-04 | Disposition: A | Discharge status: Home / Self Care | Payer: Federal, State, Local not specified - PPO | Source: Ambulatory Visit | Attending: Internal Medicine | Admitting: Internal Medicine

## 2018-10-04 ENCOUNTER — Other Ambulatory Visit: Payer: Self-pay | Admitting: Internal Medicine

## 2018-10-04 DIAGNOSIS — Z01411 Encounter for gynecological examination (general) (routine) with abnormal findings: Secondary | ICD-10-CM | POA: Diagnosis not present

## 2018-10-04 DIAGNOSIS — Z01419 Encounter for gynecological examination (general) (routine) without abnormal findings: Secondary | ICD-10-CM | POA: Diagnosis not present

## 2018-10-04 DIAGNOSIS — Z23 Encounter for immunization: Secondary | ICD-10-CM | POA: Diagnosis not present

## 2018-10-07 LAB — CYTOLOGY - PAP
Diagnosis: NEGATIVE
HPV: NOT DETECTED

## 2018-10-21 ENCOUNTER — Ambulatory Visit
Admission: RE | Admit: 2018-10-21 | Discharge: 2018-10-21 | Disposition: A | Discharge status: Home / Self Care | Payer: Federal, State, Local not specified - PPO | Source: Ambulatory Visit | Attending: Internal Medicine | Admitting: Internal Medicine

## 2018-10-21 DIAGNOSIS — Z1231 Encounter for screening mammogram for malignant neoplasm of breast: Secondary | ICD-10-CM | POA: Diagnosis not present

## 2019-05-31 DIAGNOSIS — R42 Dizziness and giddiness: Secondary | ICD-10-CM | POA: Diagnosis not present

## 2019-05-31 DIAGNOSIS — E7800 Pure hypercholesterolemia, unspecified: Secondary | ICD-10-CM | POA: Diagnosis not present

## 2019-05-31 DIAGNOSIS — R030 Elevated blood-pressure reading, without diagnosis of hypertension: Secondary | ICD-10-CM | POA: Diagnosis not present

## 2019-06-06 DIAGNOSIS — I10 Essential (primary) hypertension: Secondary | ICD-10-CM | POA: Diagnosis not present

## 2019-06-06 DIAGNOSIS — E7800 Pure hypercholesterolemia, unspecified: Secondary | ICD-10-CM | POA: Diagnosis not present

## 2019-07-13 ENCOUNTER — Emergency Department (HOSPITAL_BASED_OUTPATIENT_CLINIC_OR_DEPARTMENT_OTHER): Payer: Federal, State, Local not specified - PPO

## 2019-07-13 ENCOUNTER — Other Ambulatory Visit: Payer: Self-pay

## 2019-07-13 ENCOUNTER — Emergency Department (HOSPITAL_BASED_OUTPATIENT_CLINIC_OR_DEPARTMENT_OTHER)
Admission: EM | Admit: 2019-07-13 | Discharge: 2019-07-13 | Disposition: A | Discharge status: Home / Self Care | Payer: Federal, State, Local not specified - PPO | Attending: Emergency Medicine | Admitting: Emergency Medicine

## 2019-07-13 ENCOUNTER — Encounter (HOSPITAL_BASED_OUTPATIENT_CLINIC_OR_DEPARTMENT_OTHER): Payer: Self-pay | Admitting: *Deleted

## 2019-07-13 DIAGNOSIS — R1032 Left lower quadrant pain: Secondary | ICD-10-CM | POA: Diagnosis not present

## 2019-07-13 DIAGNOSIS — R109 Unspecified abdominal pain: Secondary | ICD-10-CM | POA: Diagnosis not present

## 2019-07-13 DIAGNOSIS — Z79899 Other long term (current) drug therapy: Secondary | ICD-10-CM | POA: Diagnosis not present

## 2019-07-13 DIAGNOSIS — D259 Leiomyoma of uterus, unspecified: Secondary | ICD-10-CM | POA: Diagnosis not present

## 2019-07-13 DIAGNOSIS — K5792 Diverticulitis of intestine, part unspecified, without perforation or abscess without bleeding: Secondary | ICD-10-CM | POA: Diagnosis not present

## 2019-07-13 DIAGNOSIS — I10 Essential (primary) hypertension: Secondary | ICD-10-CM | POA: Insufficient documentation

## 2019-07-13 DIAGNOSIS — R319 Hematuria, unspecified: Secondary | ICD-10-CM | POA: Diagnosis not present

## 2019-07-13 HISTORY — DX: Essential (primary) hypertension: I10

## 2019-07-13 LAB — CBC WITH DIFFERENTIAL/PLATELET
Abs Immature Granulocytes: 0.03 10*3/uL (ref 0.00–0.07)
Basophils Absolute: 0 10*3/uL (ref 0.0–0.1)
Basophils Relative: 0 %
Eosinophils Absolute: 0 10*3/uL (ref 0.0–0.5)
Eosinophils Relative: 0 %
HCT: 38.6 % (ref 36.0–46.0)
Hemoglobin: 12.8 g/dL (ref 12.0–15.0)
Immature Granulocytes: 0 %
Lymphocytes Relative: 20 %
Lymphs Abs: 2.2 10*3/uL (ref 0.7–4.0)
MCH: 31.3 pg (ref 26.0–34.0)
MCHC: 33.2 g/dL (ref 30.0–36.0)
MCV: 94.4 fL (ref 80.0–100.0)
Monocytes Absolute: 0.7 10*3/uL (ref 0.1–1.0)
Monocytes Relative: 7 %
Neutro Abs: 7.9 10*3/uL — ABNORMAL HIGH (ref 1.7–7.7)
Neutrophils Relative %: 73 %
Platelets: 188 10*3/uL (ref 150–400)
RBC: 4.09 MIL/uL (ref 3.87–5.11)
RDW: 13 % (ref 11.5–15.5)
WBC: 10.9 10*3/uL — ABNORMAL HIGH (ref 4.0–10.5)
nRBC: 0 % (ref 0.0–0.2)

## 2019-07-13 LAB — URINALYSIS, ROUTINE W REFLEX MICROSCOPIC
Bilirubin Urine: NEGATIVE
Glucose, UA: NEGATIVE mg/dL
Ketones, ur: 15 mg/dL — AB
Nitrite: NEGATIVE
Protein, ur: NEGATIVE mg/dL
Specific Gravity, Urine: <1.005 — ABNORMAL LOW (ref 1.005–1.030)
pH: 7 (ref 5.0–8.0)

## 2019-07-13 LAB — COMPREHENSIVE METABOLIC PANEL
ALT: 18 U/L (ref 0–44)
AST: 18 U/L (ref 15–41)
Albumin: 4.4 g/dL (ref 3.5–5.0)
Alkaline Phosphatase: 63 U/L (ref 38–126)
Anion gap: 11 (ref 5–15)
BUN: 8 mg/dL (ref 8–23)
CO2: 23 mmol/L (ref 22–32)
Calcium: 9.2 mg/dL (ref 8.9–10.3)
Chloride: 101 mmol/L (ref 98–111)
Creatinine, Ser: 0.67 mg/dL (ref 0.44–1.00)
GFR calc Af Amer: >60 mL/min (ref >60)
GFR calc non Af Amer: >60 mL/min (ref >60)
Glucose, Bld: 110 mg/dL — ABNORMAL HIGH (ref 70–99)
Potassium: 3.5 mmol/L (ref 3.5–5.1)
Sodium: 135 mmol/L (ref 135–145)
Total Bilirubin: 0.7 mg/dL (ref 0.3–1.2)
Total Protein: 7.5 g/dL (ref 6.5–8.1)

## 2019-07-13 LAB — URINALYSIS, MICROSCOPIC (REFLEX)

## 2019-07-13 LAB — LIPASE, BLOOD: Lipase: 19 U/L (ref 11–51)

## 2019-07-13 MED ORDER — AMOXICILLIN-POT CLAVULANATE 875-125 MG PO TABS: 1.0000 | ORAL_TABLET | Freq: Three times a day (TID) | ORAL | 0 refills | Status: AC

## 2019-07-13 NOTE — ED Triage Notes (Signed)
Pt c/o mid abd pain x 2 days , denies n/v/d sent here from [place] office for eval

## 2019-07-13 NOTE — Discharge Instructions (Addendum)
Ms. Gritz,  You presented to the ED with left sided abdominal pain found to have diverticulitis. Please take antibiotics as prescribed. Pain control with tylenol as needed. Please follow up with your PCP.  Incidental finding of fibroid uterus on CT scan. Please follow up with your PCP and OB/GYN for further management. If your pain is worsened or you develop fever, please seek emergent medical care.  Thank you!

## 2019-07-13 NOTE — ED Provider Notes (Signed)
[facility] EMERGENCY DEPARTMENT Provider Note   CSN: [id number] Arrival date & time: 07/13/19  1808     History   Chief Complaint Chief Complaint  Patient presents with   Abdominal Pain    HPI Gloria Daugherty is a 63 y.o. female with Hx of HTN and HTN presenting with two days of lower abdominal pain. Patient reports that yesterday after lunch, she started having pain across her lower abdomen that has since continued into today. Today she reports that the pain is more localized to the left lower quadrant with occasional radiation across the abdomen. She reports nausea but no vomiting. She denies any fevers, chills, diarrhea, chest pain or shortness of breath.    Past Medical History:  Diagnosis Date   Hypertension    Menopause    Osteoporosis    Plantar fasciitis     Patient Active Problem List   Diagnosis Date Noted   Osteopenia  Dexa done 2016  12/17/2014   Osteoporosis 09/05/2014   Vaginal dryness, menopausal 09/05/2014   Dyspareunia 09/05/2014   Hyperlipidemia 09/05/2014    Past Surgical History:  Procedure Laterality Date   NASAL SEPTUM SURGERY  1996   TUBAL LIGATION       OB History   No obstetric history on file.      Home Medications    Prior to Admission medications   Medication Sig Start Date End Date Taking? Authorizing Provider  lisinopril (ZESTRIL) 10 MG tablet Take 10 mg by mouth daily.   Yes [provider]  rosuvastatin (CRESTOR) 20 MG tablet Take 20 mg by mouth daily.   Yes [provider]  amoxicillin-clavulanate (AUGMENTIN) 875-125 MG tablet Take 1 tablet by mouth every 8 (eight) hours for 7 days. 07/13/19 07/20/19  Eliezer BottomAslam, Dalin Caldera, MD  benzonatate (TESSALON) 100 MG capsule Take 1 capsule (100 mg total) by mouth every 8 (eight) hours. 05/13/18   Dahlia ByesBast, Traci A, NP  CALCIUM PO Take 1 tablet by mouth daily.    [provider]  cetirizine-pseudoephedrine (ZYRTEC-D) 5-120 MG tablet Take 1 tablet by  mouth daily. 05/13/18   Dahlia ByesBast, Traci A, NP  Cholecalciferol (VITAMIN D PO) Take 1 tablet by mouth daily.    [provider]  diclofenac (VOLTAREN) 75 MG EC tablet TAKE 1 TABLET BY MOUTH TWICE DAILY 01/03/18   Asencion IslamStover, Titorya, DPM  fluticasone (FLONASE) 50 MCG/ACT nasal spray Place 2 sprays into both nostrils daily. 05/13/18   Dahlia ByesBast, Traci A, NP  loratadine (CLARITIN) 10 MG tablet Take 10 mg by mouth daily.    [provider]  Multiple Vitamin (MULTIVITAMIN) capsule Take 1 capsule by mouth daily. Centrum    [provider]  Omega-3 Fatty Acids (FISH OIL PO) Take 1 tablet by mouth daily.    [provider]    Family History Family History  Problem Relation Age of Onset   Diabetes Mother    Heart disease Father    Diabetes Sister    Diabetes Brother    Heart disease Brother    Diabetes Brother    Heart disease Brother    Diabetes Brother    Breast cancer Neg Hx     Social History Social History   Tobacco Use   Smoking status: Never Smoker   Smokeless tobacco: Never Used  Substance Use Topics   Alcohol use: Yes    Alcohol/week: 10.0 standard drinks    Types: 10 Standard drinks or equivalent per week   Drug use: No  Allergies   Codeine   Review of Systems Review of Systems  Constitutional: Positive for appetite change. Negative for chills and fever.  HENT: Negative.   Eyes: Negative.   Respiratory: Negative for cough, shortness of breath and wheezing.   Cardiovascular: Negative for chest pain and palpitations.  Gastrointestinal: Positive for abdominal pain. Negative for abdominal distention, blood in stool, constipation, diarrhea, nausea and vomiting.  Endocrine: Negative.   Genitourinary: Negative for decreased urine volume, dysuria, flank pain, frequency, pelvic pain, urgency, vaginal bleeding and vaginal discharge.  Musculoskeletal: Negative.   Skin: Negative.   Neurological: Negative for dizziness, speech difficulty,  weakness, numbness and headaches.  All other systems reviewed and are negative.    Physical Exam Updated Vital Signs BP 130/80 (BP Location: Right Arm)    Pulse 84    Temp 99 F (37.2 C) (Oral)    Resp 18    Ht 5\' 2"  (1.575 m)    Wt 59.9 kg    SpO2 98%    BMI 24.14 kg/m   Physical Exam Constitutional:      General: She is not in acute distress.    Appearance: She is well-developed. She is not diaphoretic.  HENT:     Mouth/Throat:     Mouth: Mucous membranes are moist.     Pharynx: Oropharynx is clear. No pharyngeal swelling or oropharyngeal exudate.  Eyes:     General: No scleral icterus.    Extraocular Movements: Extraocular movements intact.     Pupils: Pupils are equal, round, and reactive to light.  Cardiovascular:     Rate and Rhythm: Normal rate and regular rhythm.     Heart sounds: Normal heart sounds.  Pulmonary:     Effort: Pulmonary effort is normal. No respiratory distress.     Breath sounds: Normal breath sounds. No wheezing.  Abdominal:     General: Abdomen is flat. Bowel sounds are increased. There is no distension.     Palpations: Abdomen is soft.     Tenderness: There is abdominal tenderness in the suprapubic area and left lower quadrant.  Skin:    General: Skin is warm and dry.     Capillary Refill: Capillary refill takes less than 2 seconds.  Neurological:     General: No focal deficit present.     Mental Status: She is alert and oriented to person, place, and time.     Motor: No weakness.     ED Treatments / Results  Labs (all labs ordered are listed, but only abnormal results are displayed) Labs Reviewed  URINALYSIS, ROUTINE W REFLEX MICROSCOPIC - Abnormal; Notable for the following components:      Result Value   Specific Gravity, Urine <1.005 (*)    Hgb urine dipstick LARGE (*)    Ketones, ur 15 (*)    Leukocytes,Ua TRACE (*)    All other components within normal limits  CBC WITH DIFFERENTIAL/PLATELET - Abnormal; Notable for the following  components:   WBC 10.9 (*)    Neutro Abs 7.9 (*)    All other components within normal limits  COMPREHENSIVE METABOLIC PANEL - Abnormal; Notable for the following components:   Glucose, Bld 110 (*)    All other components within normal limits  URINALYSIS, MICROSCOPIC (REFLEX) - Abnormal; Notable for the following components:   Bacteria, UA RARE (*)    All other components within normal limits  LIPASE, BLOOD    EKG None  Radiology Ct Renal Stone Study  Result Date: 07/13/2019 CLINICAL DATA:  Initial evaluation for acute mid abdominal pain for 2 days. EXAM: CT ABDOMEN AND PELVIS WITHOUT CONTRAST TECHNIQUE: Multidetector CT imaging of the abdomen and pelvis was performed following the standard protocol without IV contrast. COMPARISON:  None. FINDINGS: Lower chest: Visualized lung bases are clear. Hepatobiliary: 9 mm hypodensity noted within the central aspect of the right hepatic lobe, too small the characterize, but could reflect a small cyst. Limited noncontrast evaluation of the liver is otherwise unremarkable. Layering hyperdensity within the gallbladder lumen suggestive of sludge. No frank cholelithiasis. No findings to suggest acute cholecystitis. No biliary dilatation. Pancreas: Mild hazy inflammatory stranding surrounds the pancreatic tail related to the acute inflammatory process about the adjacent colon. Pancreas otherwise unremarkable. Spleen: Spleen within normal limits. Adrenals/Urinary Tract: Adrenal glands are normal. Kidneys equal in size without evidence for nephrolithiasis or hydronephrosis. No radiopaque calculi seen along the course of either renal collecting system. No hydroureter. Partially distended bladder within normal limits. No layering stones within the bladder lumen. Stomach/Bowel: Stomach within normal limits. No evidence for bowel obstruction. Normal appendix. Abnormal wall thickening with hazy inflammatory stranding seen about the colon at the level of the splenic  flexure, consistent with acute diverticulitis. No evidence for perforation or other complication. Additional scattered noninflamed colonic diverticula seen elsewhere within the descending and sigmoid colon. No other acute inflammatory changes seen about the bowels. Vascular/Lymphatic: Intra-abdominal aorta of normal caliber. No adenopathy. Reproductive: Partially calcified fibroid noted within the uterus. Uterus and ovaries otherwise unremarkable. Other: No free air or fluid. Small fat containing paraumbilical hernia noted without associated inflammation. Musculoskeletal: No acute osseous finding. No discrete lytic or blastic osseous lesions. Moderate degenerative spondylolysis noted at L4-5 and L5-S1. IMPRESSION: 1. Findings consistent with acute diverticulitis at the level of the splenic flexure. No evidence for perforation or other complication. 2. No other acute intra-abdominal or pelvic process identified. 3. No CT evidence for nephrolithiasis or obstructive uropathy. 4. Fibroid uterus. Electronically Signed   By: Rise Mu M.D.   On: 07/13/2019 21:40    Procedures Procedures (including critical care time)  Medications Ordered in ED Medications - No data to display   Initial Impression / Assessment and Plan / ED Course  I have reviewed the triage vital signs and the nursing notes.  Pertinent labs & imaging results that were available during my care of the patient were reviewed by me and considered in my medical decision making (see chart for details).  Patient is a 63yo female with history of HTN and HLD presenting with two days of lower abdominal pain and nausea. She reports that the pain started yesterday after lunch and was across her entire lower abdomen. She reports that the pain has since localized to the left lower quadrant but still radiates across the entire lower abdomen. She has associated nausea but denies any vomiting. As a result of the abdominal pain, patient has had  decreased appetite. She had a normal bowel movement yesterday and today. Patient denies any change in bowel color or bloody stools. She denies any urinary symptoms or flank pain.   Patient is afebrile and hemodynamically stable. She does not appear to be in acute distress. She has hyperactive bowel sounds and there is tenderness to palpation of the left lower quadrant and suprapubic area. Slight leukocytosis on labs and UA with hematuria. Ddx of renal stones, diverticulitis, ovarian pathology. CT abdomen consistent with diverticulitis at splenic flexure without complications and fibroid uterus. No renal stone noted.   Patient discharged to home  with Augmentin with instructions to follow up with PCP. Also recommended for follow up with OB/GYN for further management of her fibroid uterus. Strict return precautions given. Patient and husband at bedside express understanding and agree with the discharge plan.   Final Clinical Impressions(s) / ED Diagnoses   Final diagnoses:  Diverticulitis    ED Discharge Orders         Ordered    amoxicillin-clavulanate (AUGMENTIN) 875-125 MG tablet  Every 8 hours     07/13/19 2200           Eliezer Bottom, MD 07/14/19 1258    Melene Plan, DO 07/14/19 1502

## 2019-08-04 DIAGNOSIS — Z23 Encounter for immunization: Secondary | ICD-10-CM | POA: Diagnosis not present

## 2019-08-04 DIAGNOSIS — Z0000 Encounter for general adult medical examination without abnormal findings: Secondary | ICD-10-CM | POA: Diagnosis not present

## 2019-08-04 DIAGNOSIS — I10 Essential (primary) hypertension: Secondary | ICD-10-CM | POA: Diagnosis not present

## 2019-08-04 DIAGNOSIS — Z8249 Family history of ischemic heart disease and other diseases of the circulatory system: Secondary | ICD-10-CM | POA: Diagnosis not present

## 2019-08-04 DIAGNOSIS — E7800 Pure hypercholesterolemia, unspecified: Secondary | ICD-10-CM | POA: Diagnosis not present

## 2019-10-26 ENCOUNTER — Other Ambulatory Visit: Payer: Self-pay | Admitting: Internal Medicine

## 2019-10-26 DIAGNOSIS — Z1231 Encounter for screening mammogram for malignant neoplasm of breast: Secondary | ICD-10-CM

## 2019-12-04 ENCOUNTER — Ambulatory Visit: Payer: Federal, State, Local not specified - PPO

## 2019-12-06 ENCOUNTER — Ambulatory Visit
Admission: RE | Admit: 2019-12-06 | Discharge: 2019-12-06 | Disposition: A | Discharge status: Home / Self Care | Payer: Federal, State, Local not specified - PPO | Source: Ambulatory Visit | Attending: Internal Medicine | Admitting: Internal Medicine

## 2019-12-06 ENCOUNTER — Other Ambulatory Visit: Payer: Self-pay

## 2019-12-06 DIAGNOSIS — Z1231 Encounter for screening mammogram for malignant neoplasm of breast: Secondary | ICD-10-CM

## 2019-12-20 DIAGNOSIS — G44209 Tension-type headache, unspecified, not intractable: Secondary | ICD-10-CM | POA: Diagnosis not present

## 2020-01-31 DIAGNOSIS — R635 Abnormal weight gain: Secondary | ICD-10-CM | POA: Diagnosis not present

## 2020-01-31 DIAGNOSIS — E7800 Pure hypercholesterolemia, unspecified: Secondary | ICD-10-CM | POA: Diagnosis not present

## 2020-01-31 DIAGNOSIS — I10 Essential (primary) hypertension: Secondary | ICD-10-CM | POA: Diagnosis not present

## 2020-05-10 DIAGNOSIS — K5792 Diverticulitis of intestine, part unspecified, without perforation or abscess without bleeding: Secondary | ICD-10-CM | POA: Diagnosis not present

## 2020-06-18 DIAGNOSIS — S60811A Abrasion of right wrist, initial encounter: Secondary | ICD-10-CM | POA: Diagnosis not present

## 2020-06-18 DIAGNOSIS — L089 Local infection of the skin and subcutaneous tissue, unspecified: Secondary | ICD-10-CM | POA: Diagnosis not present

## 2020-08-12 DIAGNOSIS — Z23 Encounter for immunization: Secondary | ICD-10-CM | POA: Diagnosis not present

## 2020-08-12 DIAGNOSIS — Z0000 Encounter for general adult medical examination without abnormal findings: Secondary | ICD-10-CM | POA: Diagnosis not present

## 2020-08-12 DIAGNOSIS — E7800 Pure hypercholesterolemia, unspecified: Secondary | ICD-10-CM | POA: Diagnosis not present

## 2020-08-17 ENCOUNTER — Ambulatory Visit: Payer: Federal, State, Local not specified - PPO | Attending: Internal Medicine

## 2020-08-17 DIAGNOSIS — Z23 Encounter for immunization: Secondary | ICD-10-CM

## 2020-08-17 NOTE — Progress Notes (Signed)
   Covid-19 Vaccination Clinic  Name:  Gloria Daugherty    MRN: [id number] DOB: [birth year]  08/17/2020  [name] was observed post Covid-19 immunization for 15 minutes without incident. She was provided with Vaccine Information Sheet and instruction to access the V-Safe system.   [name] was instructed to call 911 with any severe reactions post vaccine: Marland Kitchen Difficulty breathing  . Swelling of face and throat  . A fast heartbeat  . A bad rash all over body  . Dizziness and weakness

## 2020-09-06 ENCOUNTER — Other Ambulatory Visit: Payer: Self-pay | Admitting: Internal Medicine

## 2020-09-06 DIAGNOSIS — M85859 Other specified disorders of bone density and structure, unspecified thigh: Secondary | ICD-10-CM

## 2020-11-05 ENCOUNTER — Other Ambulatory Visit: Payer: Self-pay | Admitting: Internal Medicine

## 2020-11-05 DIAGNOSIS — Z1231 Encounter for screening mammogram for malignant neoplasm of breast: Secondary | ICD-10-CM

## 2020-11-12 DIAGNOSIS — R053 Chronic cough: Secondary | ICD-10-CM | POA: Diagnosis not present

## 2020-11-12 DIAGNOSIS — I10 Essential (primary) hypertension: Secondary | ICD-10-CM | POA: Diagnosis not present

## 2020-11-13 ENCOUNTER — Other Ambulatory Visit: Payer: Self-pay | Admitting: Internal Medicine

## 2020-11-13 DIAGNOSIS — E2839 Other primary ovarian failure: Secondary | ICD-10-CM

## 2020-11-13 DIAGNOSIS — M8580 Other specified disorders of bone density and structure, unspecified site: Secondary | ICD-10-CM

## 2020-11-14 ENCOUNTER — Other Ambulatory Visit: Payer: Self-pay | Admitting: Internal Medicine

## 2020-11-14 DIAGNOSIS — M8580 Other specified disorders of bone density and structure, unspecified site: Secondary | ICD-10-CM

## 2020-11-14 DIAGNOSIS — E2839 Other primary ovarian failure: Secondary | ICD-10-CM

## 2020-11-21 DIAGNOSIS — J069 Acute upper respiratory infection, unspecified: Secondary | ICD-10-CM | POA: Diagnosis not present

## 2020-12-09 ENCOUNTER — Other Ambulatory Visit: Payer: Self-pay

## 2020-12-09 ENCOUNTER — Ambulatory Visit
Admission: RE | Admit: 2020-12-09 | Discharge: 2020-12-09 | Disposition: A | Discharge status: Home / Self Care | Payer: Federal, State, Local not specified - PPO | Source: Ambulatory Visit | Attending: Internal Medicine | Admitting: Internal Medicine

## 2020-12-09 DIAGNOSIS — Z1231 Encounter for screening mammogram for malignant neoplasm of breast: Secondary | ICD-10-CM | POA: Diagnosis not present

## 2020-12-12 DIAGNOSIS — I10 Essential (primary) hypertension: Secondary | ICD-10-CM | POA: Diagnosis not present

## 2021-01-06 DIAGNOSIS — Z23 Encounter for immunization: Secondary | ICD-10-CM | POA: Diagnosis not present

## 2021-02-19 DIAGNOSIS — Z01419 Encounter for gynecological examination (general) (routine) without abnormal findings: Secondary | ICD-10-CM | POA: Diagnosis not present

## 2021-02-26 ENCOUNTER — Other Ambulatory Visit: Payer: Self-pay

## 2021-02-26 ENCOUNTER — Emergency Department (HOSPITAL_BASED_OUTPATIENT_CLINIC_OR_DEPARTMENT_OTHER)
Admission: EM | Admit: 2021-02-26 | Discharge: 2021-02-26 | Disposition: A | Discharge status: Home / Self Care | Payer: Federal, State, Local not specified - PPO | Attending: Emergency Medicine | Admitting: Emergency Medicine

## 2021-02-26 ENCOUNTER — Emergency Department (HOSPITAL_BASED_OUTPATIENT_CLINIC_OR_DEPARTMENT_OTHER): Payer: Federal, State, Local not specified - PPO

## 2021-02-26 DIAGNOSIS — Z79899 Other long term (current) drug therapy: Secondary | ICD-10-CM | POA: Diagnosis not present

## 2021-02-26 DIAGNOSIS — R55 Syncope and collapse: Secondary | ICD-10-CM | POA: Diagnosis not present

## 2021-02-26 DIAGNOSIS — Z20822 Contact with and (suspected) exposure to covid-19: Secondary | ICD-10-CM | POA: Diagnosis not present

## 2021-02-26 DIAGNOSIS — R42 Dizziness and giddiness: Secondary | ICD-10-CM | POA: Diagnosis not present

## 2021-02-26 DIAGNOSIS — R531 Weakness: Secondary | ICD-10-CM | POA: Diagnosis not present

## 2021-02-26 DIAGNOSIS — R5383 Other fatigue: Secondary | ICD-10-CM | POA: Insufficient documentation

## 2021-02-26 DIAGNOSIS — R519 Headache, unspecified: Secondary | ICD-10-CM | POA: Diagnosis not present

## 2021-02-26 DIAGNOSIS — I10 Essential (primary) hypertension: Secondary | ICD-10-CM | POA: Diagnosis not present

## 2021-02-26 LAB — CBC WITH DIFFERENTIAL/PLATELET
Abs Immature Granulocytes: 0 10*3/uL (ref 0.00–0.07)
Basophils Absolute: 0 10*3/uL (ref 0.0–0.1)
Basophils Relative: 1 %
Eosinophils Absolute: 0.2 10*3/uL (ref 0.0–0.5)
Eosinophils Relative: 4 %
HCT: 41 % (ref 36.0–46.0)
Hemoglobin: 13.7 g/dL (ref 12.0–15.0)
Immature Granulocytes: 0 %
Lymphocytes Relative: 54 %
Lymphs Abs: 2.5 10*3/uL (ref 0.7–4.0)
MCH: 31.4 pg (ref 26.0–34.0)
MCHC: 33.4 g/dL (ref 30.0–36.0)
MCV: 93.8 fL (ref 80.0–100.0)
Monocytes Absolute: 0.3 10*3/uL (ref 0.1–1.0)
Monocytes Relative: 7 %
Neutro Abs: 1.6 10*3/uL — ABNORMAL LOW (ref 1.7–7.7)
Neutrophils Relative %: 34 %
Platelets: 206 10*3/uL (ref 150–400)
RBC: 4.37 MIL/uL (ref 3.87–5.11)
RDW: 13.1 % (ref 11.5–15.5)
WBC: 4.6 10*3/uL (ref 4.0–10.5)
nRBC: 0 % (ref 0.0–0.2)

## 2021-02-26 LAB — RESP PANEL BY RT-PCR (FLU A&B, COVID) ARPGX2
Influenza A by PCR: NEGATIVE
Influenza B by PCR: NEGATIVE
SARS Coronavirus 2 by RT PCR: NEGATIVE

## 2021-02-26 LAB — COMPREHENSIVE METABOLIC PANEL
ALT: 21 U/L (ref 0–44)
AST: 19 U/L (ref 15–41)
Albumin: 4.5 g/dL (ref 3.5–5.0)
Alkaline Phosphatase: 72 U/L (ref 38–126)
Anion gap: 8 (ref 5–15)
BUN: 14 mg/dL (ref 8–23)
CO2: 26 mmol/L (ref 22–32)
Calcium: 9 mg/dL (ref 8.9–10.3)
Chloride: 105 mmol/L (ref 98–111)
Creatinine, Ser: 0.53 mg/dL (ref 0.44–1.00)
GFR, Estimated: >60 mL/min (ref >60)
Glucose, Bld: 97 mg/dL (ref 70–99)
Potassium: 3.8 mmol/L (ref 3.5–5.1)
Sodium: 139 mmol/L (ref 135–145)
Total Bilirubin: 0.3 mg/dL (ref 0.3–1.2)
Total Protein: 7.2 g/dL (ref 6.5–8.1)

## 2021-02-26 LAB — URINALYSIS, ROUTINE W REFLEX MICROSCOPIC
Bilirubin Urine: NEGATIVE
Glucose, UA: NEGATIVE mg/dL
Ketones, ur: NEGATIVE mg/dL
Leukocytes,Ua: NEGATIVE
Nitrite: NEGATIVE
Protein, ur: NEGATIVE mg/dL
Specific Gravity, Urine: <1.005 — ABNORMAL LOW (ref 1.005–1.030)
pH: 7 (ref 5.0–8.0)

## 2021-02-26 LAB — TROPONIN I (HIGH SENSITIVITY): Troponin I (High Sensitivity): 3 ng/L (ref <18)

## 2021-02-26 LAB — CBG MONITORING, ED: Glucose-Capillary: 97 mg/dL (ref 70–99)

## 2021-02-26 MED ORDER — SODIUM CHLORIDE 0.9 % IV BOLUS
1000.0000 mL | Freq: Once | INTRAVENOUS | Status: AC
  Administered 2021-02-26: 1000 mL via INTRAVENOUS

## 2021-02-26 NOTE — ED Notes (Signed)
Generalized weakness

## 2021-02-26 NOTE — Discharge Instructions (Addendum)
Overall work-up today is normal.  Follow-up with your primary care doctor for follow-up visit.  They will likely need to check your vitamin levels, thyroid studies.  Recommend echocardiogram of your heart.  We will also need may be some adjustment to your blood pressure medications as well.

## 2021-02-26 NOTE — ED Provider Notes (Signed)
[facility] EMERGENCY DEPT Provider Note   CSN: [id number] Arrival date & time: 02/26/21  1941     History Chief Complaint  Patient presents with  . Fatigue    Gloria Daugherty is a 65 y.o. female.  Patient with history of hypertension.  Here for generalized fatigue for the last several weeks.  Worse yesterday and today.  Had near syncopal episode where she felt lightheaded like she is going to pass out but she did not.  She has no chest pain, no shortness of breath, no abdominal pain.  No infectious symptoms.  The history is provided by the patient.  Weakness Severity:  Mild Onset quality:  Gradual Timing:  Intermittent Progression:  Waxing and waning Relieved by:  Nothing Worsened by:  Nothing Associated symptoms: no abdominal pain, no arthralgias, no chest pain, no cough, no diarrhea, no dizziness, no dysuria, no fever, no headaches, no nausea, no seizures, no shortness of breath and no vomiting   Risk factors: no new medications        Past Medical History:  Diagnosis Date  . Hypertension   . Menopause   . Osteoporosis   . Plantar fasciitis     Patient Active Problem List   Diagnosis Date Noted  . Osteopenia  Dexa done 2016  12/17/2014  . Osteoporosis 09/05/2014  . Vaginal dryness, menopausal 09/05/2014  . Dyspareunia 09/05/2014  . Hyperlipidemia 09/05/2014    Past Surgical History:  Procedure Laterality Date  . NASAL SEPTUM SURGERY  1996  . TUBAL LIGATION       OB History   No obstetric history on file.     Family History  Problem Relation Age of Onset  . Diabetes Mother   . Heart disease Father   . Diabetes Sister   . Diabetes Brother   . Heart disease Brother   . Diabetes Brother   . Heart disease Brother   . Diabetes Brother   . Breast cancer Neg Hx     Social History   Tobacco Use  . Smoking status: Never Smoker  . Smokeless tobacco: Never Used  Vaping Use  . Vaping Use: Never used  Substance Use Topics  .  Alcohol use: Yes    Alcohol/week: 10.0 standard drinks    Types: 10 Standard drinks or equivalent per week  . Drug use: No    Home Medications Prior to Admission medications   Medication Sig Start Date End Date Taking? Authorizing Provider  CALCIUM PO Take 1 tablet by mouth daily.   Yes [provider]  Cholecalciferol (VITAMIN D PO) Take 1 tablet by mouth daily.   Yes [provider]  lisinopril (ZESTRIL) 10 MG tablet Take 10 mg by mouth daily.   Yes [provider]  loratadine (CLARITIN) 10 MG tablet Take 10 mg by mouth daily.   Yes [provider]  Multiple Vitamin (MULTIVITAMIN) capsule Take 1 capsule by mouth daily. Centrum   Yes [provider]  Omega-3 Fatty Acids (FISH OIL PO) Take 1 tablet by mouth daily.   Yes [provider]  rosuvastatin (CRESTOR) 20 MG tablet Take 20 mg by mouth daily.   Yes [provider]  fluticasone (FLONASE) 50 MCG/ACT nasal spray Place 2 sprays into both nostrils daily. 05/13/18   Janace Aris, NP    Allergies    Codeine  Review of Systems   Review of Systems  Constitutional: Positive for fatigue. Negative for chills and fever.  HENT: Negative for ear pain  and sore throat.   Eyes: Negative for pain and visual disturbance.  Respiratory: Negative for cough and shortness of breath.   Cardiovascular: Negative for chest pain, palpitations and leg swelling.  Gastrointestinal: Negative for abdominal pain, diarrhea, nausea and vomiting.  Genitourinary: Negative for dysuria and hematuria.  Musculoskeletal: Negative for arthralgias and back pain.  Skin: Negative for color change, rash and wound.  Neurological: Positive for weakness (generalized) and light-headedness. Negative for dizziness, tremors, seizures, syncope, facial asymmetry, speech difficulty, numbness and headaches.  All other systems reviewed and are negative.   Physical Exam Updated Vital Signs  ED Triage Vitals  Enc Vitals  Group     BP 02/26/21 0932 (!) 172/95     Pulse Rate 02/26/21 0932 70     Resp 02/26/21 0932 12     Temp 02/26/21 0932 98.6 F (37 C)     Temp Source 02/26/21 0932 Oral     SpO2 02/26/21 0932 100 %     Weight 02/26/21 0933 138 lb (62.6 kg)     Height 02/26/21 0933 5\' 3"  (1.6 m)     Head Circumference --      Peak Flow --      Pain Score --      Pain Loc --      Pain Edu? --      Excl. in GC? --     Physical Exam Vitals and nursing note reviewed.  Constitutional:      General: She is not in acute distress.    Appearance: She is well-developed. She is not ill-appearing.  HENT:     Head: Normocephalic and atraumatic.     Nose: Nose normal.     Mouth/Throat:     Mouth: Mucous membranes are moist.  Eyes:     Extraocular Movements: Extraocular movements intact.     Conjunctiva/sclera: Conjunctivae normal.     Pupils: Pupils are equal, round, and reactive to light.  Cardiovascular:     Rate and Rhythm: Normal rate and regular rhythm.     Pulses: Normal pulses.     Heart sounds: Normal heart sounds. No murmur heard.   Pulmonary:     Effort: Pulmonary effort is normal. No respiratory distress.     Breath sounds: Normal breath sounds.  Abdominal:     Palpations: Abdomen is soft.     Tenderness: There is no abdominal tenderness.  Musculoskeletal:     Cervical back: Normal range of motion and neck supple.  Skin:    General: Skin is warm and dry.     Capillary Refill: Capillary refill takes less than 2 seconds.  Neurological:     General: No focal deficit present.     Mental Status: She is alert and oriented to person, place, and time.     Cranial Nerves: No cranial nerve deficit.     Sensory: No sensory deficit.     Motor: No weakness.     Coordination: Coordination normal.     Comments: 5+ out of 5 strength throughout, normal sensation, no drift, normal finger-to-nose finger     ED Results / Procedures / Treatments   Labs (all labs ordered are listed, but only  abnormal results are displayed) Labs Reviewed  CBC WITH DIFFERENTIAL/PLATELET - Abnormal; Notable for the following components:      Result Value   Neutro Abs 1.6 (*)    All other components within normal limits  URINALYSIS, ROUTINE W REFLEX MICROSCOPIC - Abnormal; Notable for the following components:  Color, Urine COLORLESS (*)    Specific Gravity, Urine <1.005 (*)    Hgb urine dipstick TRACE (*)    Bacteria, UA RARE (*)    All other components within normal limits  RESP PANEL BY RT-PCR (FLU A&B, COVID) [id number]  COMPREHENSIVE METABOLIC PANEL  CBG MONITORING, ED  TROPONIN I (HIGH SENSITIVITY)    EKG EKG Interpretation  Date/Time:  Wednesday Feb 26 2021 09:28:55 EDT Ventricular Rate:  73 PR Interval:  191 QRS Duration: 90 QT Interval:  414 QTC Calculation: 457 R Axis:   49 Text Interpretation: Sinus rhythm Anterior infarct, old Confirmed by Virgina Norfolk [phone]) on 02/26/2021 9:49:02 AM   Radiology CT Head Wo Contrast  Result Date: 02/26/2021 CLINICAL DATA:  Fatigue Headache EXAM: CT HEAD WITHOUT CONTRAST TECHNIQUE: Contiguous axial images were obtained from the base of the skull through the vertex without intravenous contrast. COMPARISON:  04/05/2018 FINDINGS: Brain: No evidence of acute infarction, hemorrhage, hydrocephalus, extra-axial collection or mass lesion/mass effect. Vascular: No hyperdense vessel or unexpected calcification. Skull: Normal. Negative for fracture or focal lesion. Sinuses/Orbits: No acute finding. Other: None. IMPRESSION: No acute intracranial abnormality Electronically Signed   By: Acquanetta Belling M.D.   On: 02/26/2021 10:44   DG Chest Portable 1 View  Result Date: 02/26/2021 CLINICAL DATA:  Syncope, fatigue, history hypertension EXAM: PORTABLE CHEST 1 VIEW COMPARISON:  Portable exam 0946 hours without priors for comparison FINDINGS: Normal heart size, mediastinal contours, and pulmonary vascularity. Lungs clear. No infiltrate, pleural effusion, or  pneumothorax. Osseous demineralization. IMPRESSION: No acute abnormalities. Electronically Signed   By: Ulyses Southward M.D.   On: 02/26/2021 10:13    Procedures Procedures   Medications Ordered in ED Medications  sodium chloride 0.9 % bolus 1,000 mL (1,000 mLs Intravenous New Bag/Given 02/26/21 1028)    ED Course  I have reviewed the triage vital signs and the nursing notes.  Pertinent labs & imaging results that were available during my care of the patient were reviewed by me and considered in my medical decision making (see chart for details).    MDM Rules/Calculators/A&P                          Josphine Laffey is here with fatigue.  History of hypertension.  Patient hypertensive to 172/95 otherwise normal vitals.  EKG shows sinus rhythm.  Some nonspecific ST changes in V1 and V2 that was reviewed with cardiologist Dr. Excell Seltzer.  Does not appear to be equivalent to a STEMI.  Has some Q waves.  She is not having any chest pain.  Overall generalized fatigue for the last several weeks.  Worse over the last 2 days.  Felt like she is going to pass out today but did not.  Will check basic labs including head CT, troponin.  Overall exam is normal.  Symptoms are nonspecific.  Will check for any kidney function issues are troponin leak.  Will likely need close follow-up with primary care doctor for medication review given blood pressure still slightly elevated.  Lab work showed no significant anemia, electrolyte abnormality, kidney injury.  Chest x-ray negative for pneumonia.  Urinalysis negative for infection.  COVID test and influenza test negative.  Patient also with negative head CT.  Overall suspect generalized weakness may be in the setting of vitamin deficiency or thyroid deficiency.  Could be depression related as well given its chronicity.  Overall exam is reassuring.  Recommend that she follow-up with primary care doctor  to discuss getting some other test results.  Likely would benefit  from an echocardiogram as well.  Discharged in good condition.  This chart was dictated using voice recognition software.  Despite best efforts to proofread,  errors can occur which can change the documentation meaning.    Final Clinical Impression(s) / ED Diagnoses Final diagnoses:  Weakness    Rx / DC Orders ED Discharge Orders    None       Virgina NorfolkCuratolo, Cydni Reddoch, DO 02/26/21 1103

## 2021-02-26 NOTE — ED Triage Notes (Signed)
She reports feeling "fatigued a lot" since Jan. Of this year. She further reports feeling "weak sometimes, and even start to lose my balance" for past couple of months. She is here today with c/o near-syncope this morning while sitting in a chair speaking with her daughter on the phone. She is alert and in no distress.

## 2021-02-27 DIAGNOSIS — R5383 Other fatigue: Secondary | ICD-10-CM | POA: Diagnosis not present

## 2021-02-27 DIAGNOSIS — G43109 Migraine with aura, not intractable, without status migrainosus: Secondary | ICD-10-CM | POA: Diagnosis not present

## 2021-02-27 DIAGNOSIS — H8110 Benign paroxysmal vertigo, unspecified ear: Secondary | ICD-10-CM | POA: Diagnosis not present

## 2021-02-27 DIAGNOSIS — R42 Dizziness and giddiness: Secondary | ICD-10-CM | POA: Diagnosis not present

## 2021-03-04 DIAGNOSIS — I10 Essential (primary) hypertension: Secondary | ICD-10-CM | POA: Diagnosis not present

## 2021-03-24 ENCOUNTER — Other Ambulatory Visit: Payer: Self-pay

## 2021-03-24 ENCOUNTER — Ambulatory Visit
Admission: RE | Admit: 2021-03-24 | Discharge: 2021-03-24 | Disposition: A | Discharge status: Home / Self Care | Payer: Federal, State, Local not specified - PPO | Source: Ambulatory Visit | Attending: Internal Medicine | Admitting: Internal Medicine

## 2021-03-24 DIAGNOSIS — M8580 Other specified disorders of bone density and structure, unspecified site: Secondary | ICD-10-CM

## 2021-03-24 DIAGNOSIS — E2839 Other primary ovarian failure: Secondary | ICD-10-CM

## 2021-03-24 DIAGNOSIS — Z780 Asymptomatic menopausal state: Secondary | ICD-10-CM | POA: Diagnosis not present

## 2021-03-24 DIAGNOSIS — M810 Age-related osteoporosis without current pathological fracture: Secondary | ICD-10-CM | POA: Diagnosis not present

## 2021-04-07 DIAGNOSIS — E7800 Pure hypercholesterolemia, unspecified: Secondary | ICD-10-CM | POA: Diagnosis not present

## 2021-04-07 DIAGNOSIS — I10 Essential (primary) hypertension: Secondary | ICD-10-CM | POA: Diagnosis not present

## 2021-06-16 DIAGNOSIS — Z23 Encounter for immunization: Secondary | ICD-10-CM | POA: Diagnosis not present

## 2021-08-27 DIAGNOSIS — M810 Age-related osteoporosis without current pathological fracture: Secondary | ICD-10-CM | POA: Diagnosis not present

## 2021-08-27 DIAGNOSIS — E7800 Pure hypercholesterolemia, unspecified: Secondary | ICD-10-CM | POA: Diagnosis not present

## 2021-08-27 DIAGNOSIS — I10 Essential (primary) hypertension: Secondary | ICD-10-CM | POA: Diagnosis not present

## 2021-08-27 DIAGNOSIS — Z0000 Encounter for general adult medical examination without abnormal findings: Secondary | ICD-10-CM | POA: Diagnosis not present

## 2021-09-04 DIAGNOSIS — Z0000 Encounter for general adult medical examination without abnormal findings: Secondary | ICD-10-CM | POA: Diagnosis not present

## 2021-09-04 DIAGNOSIS — E7800 Pure hypercholesterolemia, unspecified: Secondary | ICD-10-CM | POA: Diagnosis not present

## 2021-09-04 DIAGNOSIS — M810 Age-related osteoporosis without current pathological fracture: Secondary | ICD-10-CM | POA: Diagnosis not present

## 2021-11-07 ENCOUNTER — Other Ambulatory Visit: Payer: Self-pay | Admitting: Family Medicine

## 2021-11-07 DIAGNOSIS — Z1231 Encounter for screening mammogram for malignant neoplasm of breast: Secondary | ICD-10-CM

## 2021-12-10 ENCOUNTER — Ambulatory Visit
Admission: RE | Admit: 2021-12-10 | Discharge: 2021-12-10 | Disposition: A | Discharge status: Home / Self Care | Payer: Federal, State, Local not specified - PPO | Source: Ambulatory Visit | Attending: Family Medicine | Admitting: Family Medicine

## 2021-12-10 DIAGNOSIS — Z1231 Encounter for screening mammogram for malignant neoplasm of breast: Secondary | ICD-10-CM | POA: Diagnosis not present

## 2022-02-23 DIAGNOSIS — Z01419 Encounter for gynecological examination (general) (routine) without abnormal findings: Secondary | ICD-10-CM | POA: Diagnosis not present

## 2022-06-01 IMAGING — CT CT HEAD W/O CM
4 series · 17 of 47 positions shown, 19 images · non-contrast
Comparison: 04/05/2018

CLINICAL DATA: Fatigue

Headache
EXAM:
CT HEAD WITHOUT CONTRAST
TECHNIQUE: Contiguous axial images were obtained from the base of the skull
through the vertex without intravenous contrast.

[Series 2: head wo · axial · 0.39mm/px · z∈[-177,-62]mm · 7 of 31 slices shown, 9 images]
[im 4/31  brain]
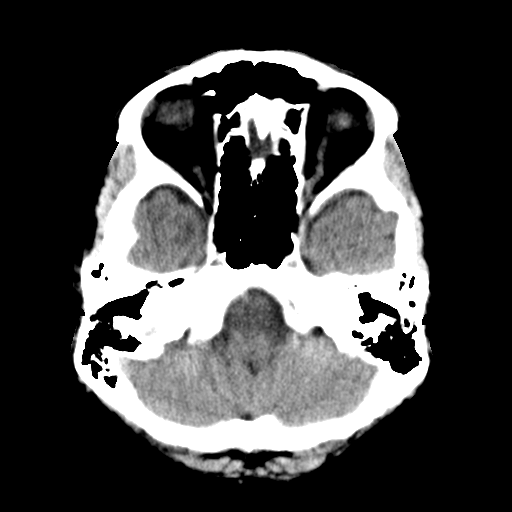
[im 4/31  bone]
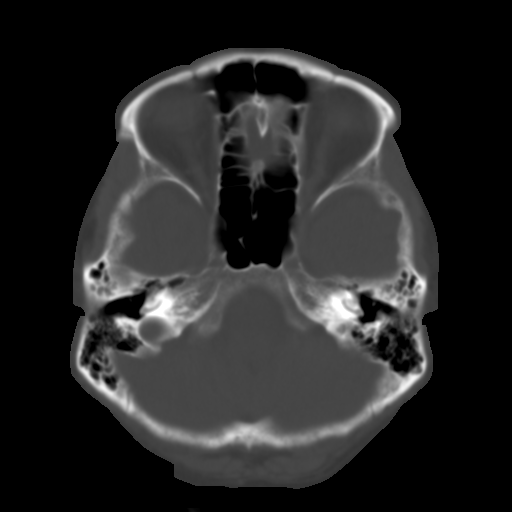
[im 8/31  brain]
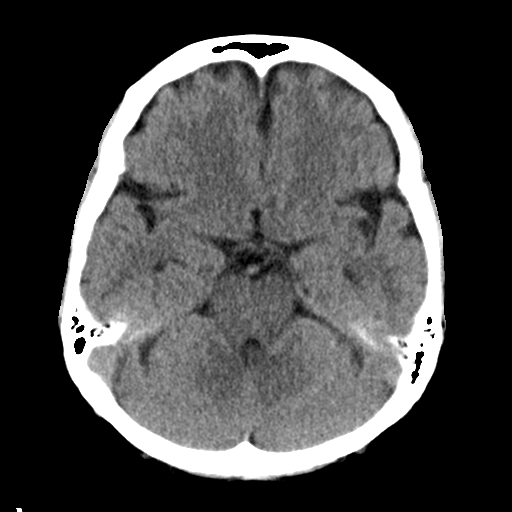
[im 12/31  brain]
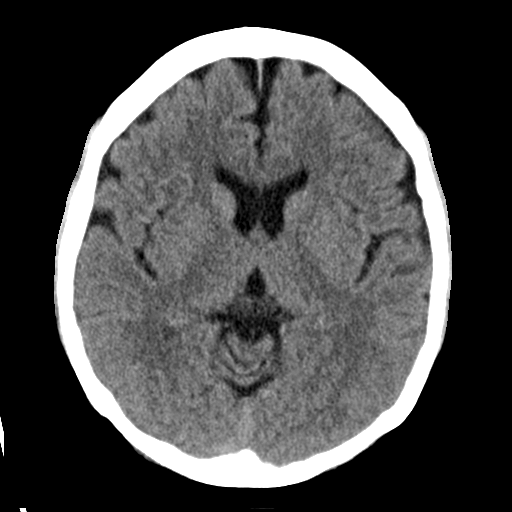
[im 16/31  brain]
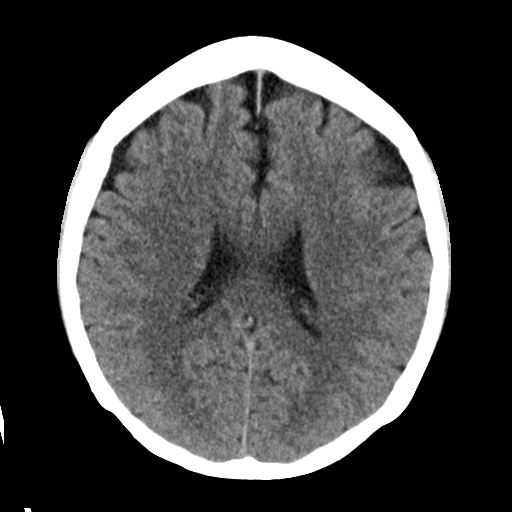
[im 19/31  brain]
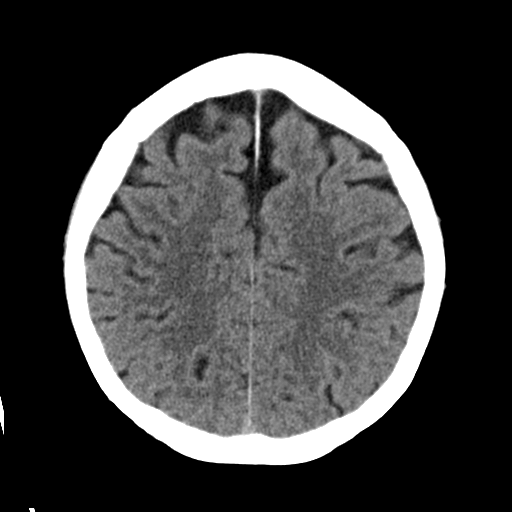
[im 19/31  bone]
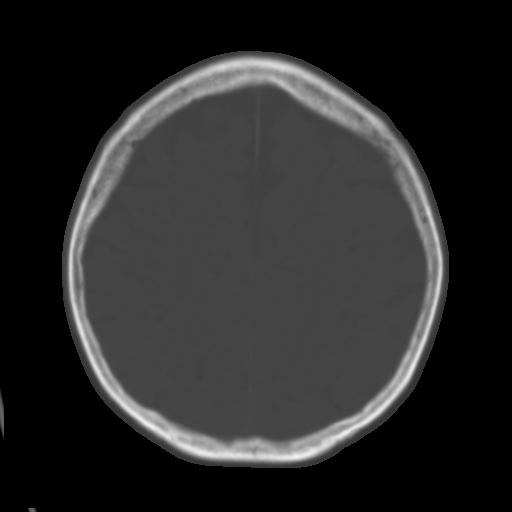
[im 23/31  brain]
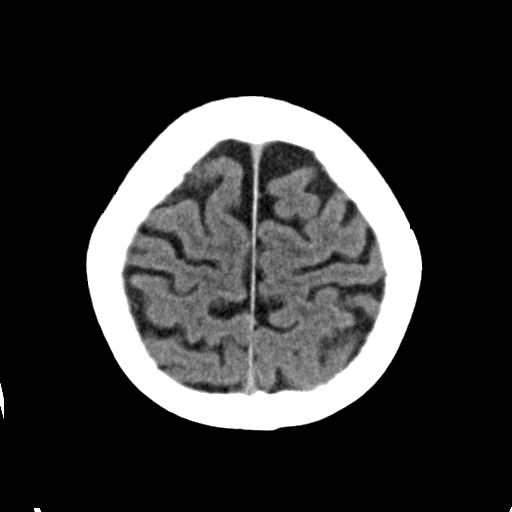
[im 27/31  brain]
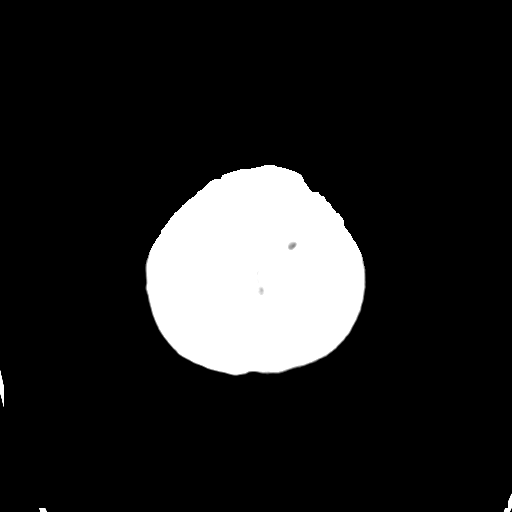

[Series 3: head bone · axial · 0.39mm/px · z∈[-178,-124]mm · 4 of 78 slices shown]
[im 8/78  bone]
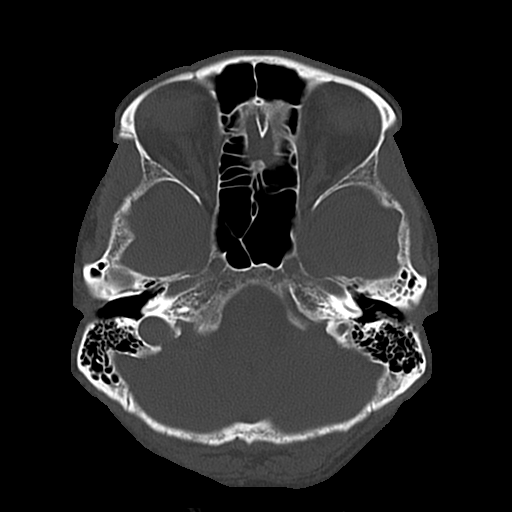
[im 16/78  bone]
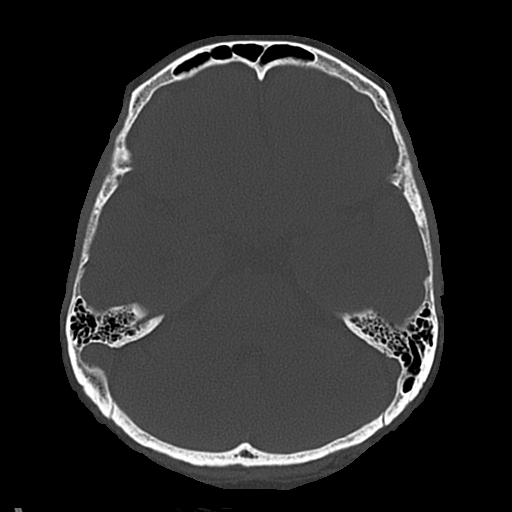
[im 24/78  bone]
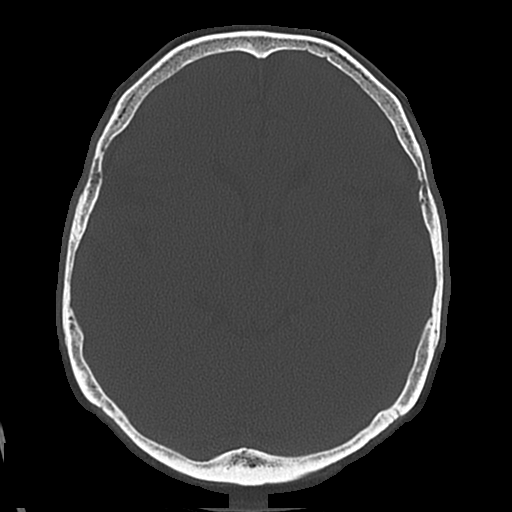
[im 35/78  bone]
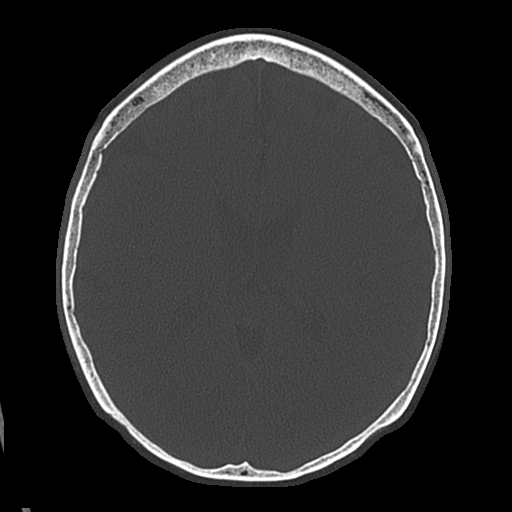

[Series 4: coronal soft · coronal · 0.31mm/px · 3 of 66 slices shown]
[im 22/66  brain]
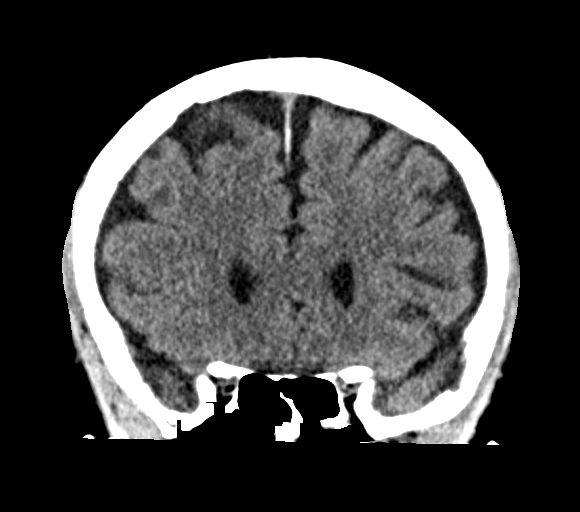
[im 29/66  brain]
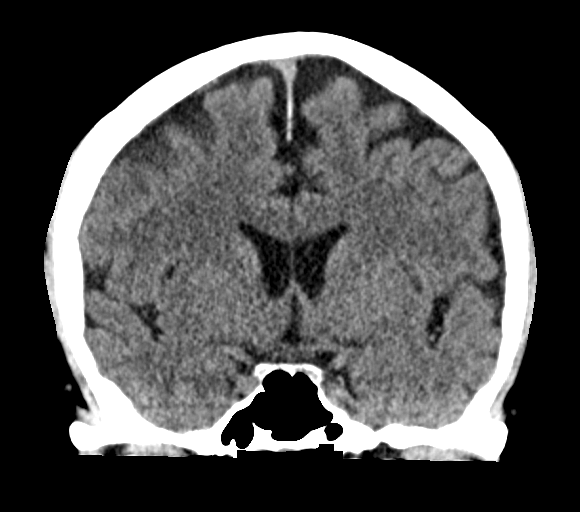
[im 37/66  brain]
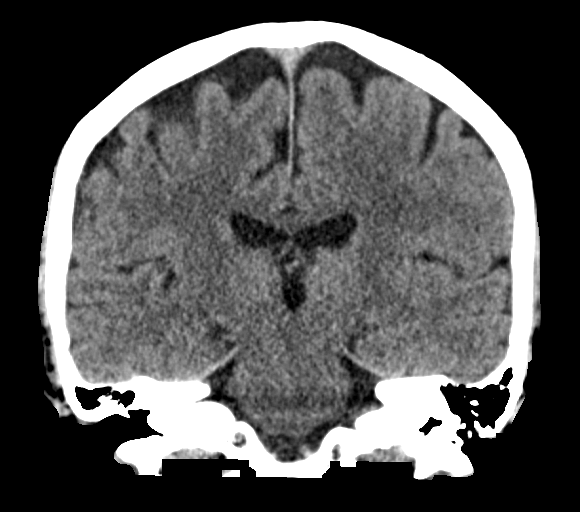

[Series 5: sagittal soft · sagittal · 0.31mm/px · 3 of 60 slices shown]
[im 20/60  brain]
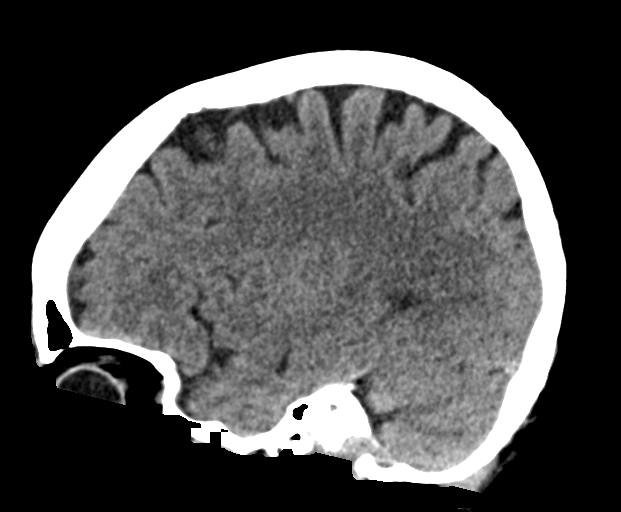
[im 30/60  brain]
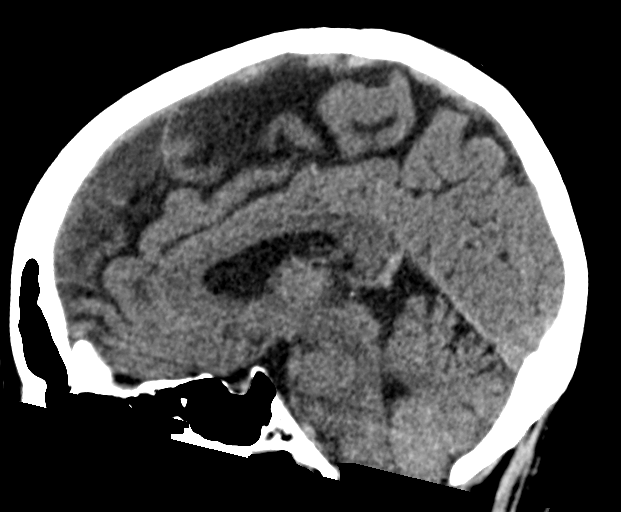
[im 40/60  brain]
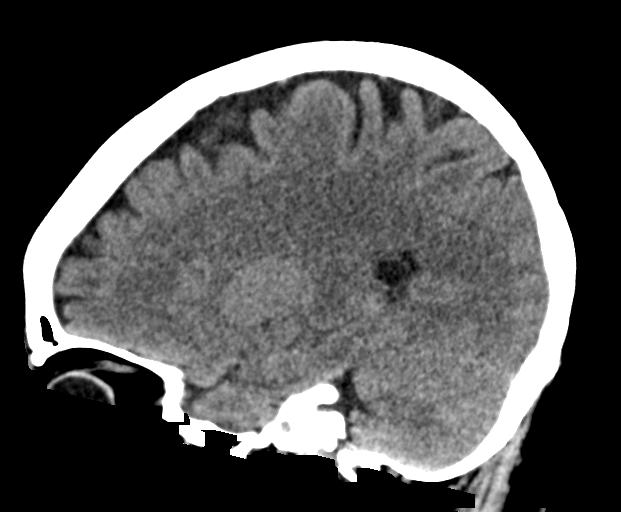

[17 of 47 positions shown; findings below may reference images not displayed]

FINDINGS: Brain: No evidence of acute infarction, hemorrhage, hydrocephalus,
extra-axial collection or mass lesion/mass effect.

Vascular: No hyperdense vessel or unexpected calcification.

Skull: Normal. Negative for fracture or focal lesion.

Sinuses/Orbits: No acute finding.

Other: None.
IMPRESSION: No acute intracranial abnormality

## 2022-09-18 NOTE — Progress Notes (Unsigned)
Gloria Brothers FNP Reason for referral-Abnormal ECG and FH of CAD  HPI: 66 yo female for evaluation of abnormal ECG and FH of CAD at request of Jorge Ny FNP. Labs 11/23 showed LDL 76  and Cr 0.59.   Current Outpatient Medications  Medication Sig Dispense Refill   CALCIUM PO Take 1 tablet by mouth daily.     Cholecalciferol (VITAMIN D PO) Take 1 tablet by mouth daily.     fluticasone (FLONASE) 50 MCG/ACT nasal spray Place 2 sprays into both nostrils daily. 16 g 2   lisinopril (ZESTRIL) 10 MG tablet Take 10 mg by mouth daily.     loratadine (CLARITIN) 10 MG tablet Take 10 mg by mouth daily.     Multiple Vitamin (MULTIVITAMIN) capsule Take 1 capsule by mouth daily. Centrum     Omega-3 Fatty Acids (FISH OIL PO) Take 1 tablet by mouth daily.     rosuvastatin (CRESTOR) 20 MG tablet Take 20 mg by mouth daily.     Current Facility-Administered Medications  Medication Dose Route Frequency Provider Last Rate Last Admin   triamcinolone acetonide (KENALOG) 10 MG/ML injection 10 mg  10 mg Other Once Asencion Islam, DPM        Allergies  Allergen Reactions   Codeine Other (See Comments)    Headache     Past Medical History:  Diagnosis Date   Hypertension    Menopause    Osteoporosis    Plantar fasciitis     Past Surgical History:  Procedure Laterality Date   NASAL SEPTUM SURGERY  1996   TUBAL LIGATION      Social History   Socioeconomic History   Marital status: Married    Spouse name: Not on file   Number of children: Not on file   Years of education: Not on file   Highest education level: Not on file  Occupational History   Not on file  Tobacco Use   Smoking status: Never   Smokeless tobacco: Never  Vaping Use   Vaping Use: Never used  Substance and Sexual Activity   Alcohol use: Yes    Alcohol/week: 10.0 standard drinks of alcohol    Types: 10 Standard drinks or equivalent per week   Drug use: No   Sexual activity: Yes    Partners: Male     Birth control/protection: Post-menopausal  Other Topics Concern   Not on file  Social History Narrative   Not on file   Social Determinants of Health   Financial Resource Strain: Not on file  Food Insecurity: Not on file  Transportation Needs: Not on file  Physical Activity: Not on file  Stress: Not on file  Social Connections: Not on file  Intimate Partner Violence: Not on file    Family History  Problem Relation Age of Onset   Diabetes Mother    Heart disease Father    Diabetes Sister    Diabetes Brother    Heart disease Brother    Diabetes Brother    Heart disease Brother    Diabetes Brother    Breast cancer Neg Hx     ROS: no fevers or chills, productive cough, hemoptysis, dysphasia, odynophagia, melena, hematochezia, dysuria, hematuria, rash, seizure activity, orthopnea, PND, pedal edema, claudication. Remaining systems are negative.  Physical Exam:   There were no vitals taken for this visit.  General:  Well developed/well nourished in NAD Skin warm/dry Patient not depressed No peripheral clubbing Back-normal HEENT-normal/normal eyelids Neck supple/normal carotid upstroke bilaterally;  no bruits; no JVD; no thyromegaly chest - CTA/ normal expansion CV - RRR/normal S1 and S2; no murmurs, rubs or gallops;  PMI nondisplaced Abdomen -NT/ND, no HSM, no mass, + bowel sounds, no bruit 2+ femoral pulses, no bruits Ext-no edema, chords, 2+ DP Neuro-grossly nonfocal  ECG - 09/04/22 NSR, ILBBB; personally reviewed  A/P  1 Abnormal ECG-  2 Hypertension-  3 Hyperlipidemia-  4 FH of CAD-  [name], MD

## 2022-09-21 ENCOUNTER — Ambulatory Visit: Payer: Federal, State, Local not specified - PPO | Attending: Cardiology | Admitting: Cardiology

## 2022-09-21 ENCOUNTER — Encounter: Payer: Self-pay | Admitting: Cardiology

## 2022-09-21 VITALS — BP 130/72 | HR 77 | Ht 63.0 in | Wt 140.9 lb

## 2022-09-21 DIAGNOSIS — Z136 Encounter for screening for cardiovascular disorders: Secondary | ICD-10-CM | POA: Diagnosis not present

## 2022-09-21 DIAGNOSIS — R9431 Abnormal electrocardiogram [ECG] [EKG]: Secondary | ICD-10-CM

## 2022-09-21 NOTE — Patient Instructions (Signed)
  Testing/Procedures: Your physician has requested that you have an echocardiogram. Echocardiography is a painless test that uses sound waves to create images of your heart. It provides your doctor with information about the size and shape of your heart and how well your heart's chambers and valves are working. This procedure takes approximately one hour. There are no restrictions for this procedure. Please do NOT wear cologne, perfume, aftershave, or lotions (deodorant is allowed). Please arrive 15 minutes prior to your appointment time. [address]   CORONARY CALCIUM SCORING CT SCAN AT THE [facility]   Follow-Up: At [facility], you and your health needs are our priority.  As part of our continuing mission to provide you with exceptional heart care, we have created designated Provider Care Teams.  These Care Teams include your primary Cardiologist (physician) and Advanced Practice Providers (APPs -  Physician Assistants and Nurse Practitioners) who all work together to provide you with the care you need, when you need it.  We recommend signing up for the patient portal called "MyChart".  Sign up information is provided on this After Visit Summary.  MyChart is used to connect with patients for Virtual Visits (Telemedicine).  Patients are able to view lab/test results, encounter notes, upcoming appointments, etc.  Non-urgent messages can be sent to your provider as well.   To learn more about what you can do with MyChart, go to [email or web address].    Your next appointment:   12 month(s)  The format for your next appointment:   In Person  Provider:   Olga Millers MD

## 2022-10-21 ENCOUNTER — Ambulatory Visit (HOSPITAL_COMMUNITY): Payer: Medicare Other | Attending: Cardiology

## 2022-10-21 DIAGNOSIS — R9431 Abnormal electrocardiogram [ECG] [EKG]: Secondary | ICD-10-CM | POA: Diagnosis present

## 2022-10-21 LAB — ECHOCARDIOGRAM COMPLETE
Area-P 1/2: 5.2 cm2
S' Lateral: 2.7 cm

## 2022-11-04 ENCOUNTER — Ambulatory Visit (HOSPITAL_BASED_OUTPATIENT_CLINIC_OR_DEPARTMENT_OTHER)
Admission: RE | Admit: 2022-11-04 | Discharge: 2022-11-04 | Disposition: A | Discharge status: Home / Self Care | Payer: Federal, State, Local not specified - PPO | Source: Ambulatory Visit | Attending: Cardiology | Admitting: Cardiology

## 2022-11-04 ENCOUNTER — Encounter (HOSPITAL_BASED_OUTPATIENT_CLINIC_OR_DEPARTMENT_OTHER): Payer: Self-pay

## 2022-11-04 DIAGNOSIS — Z136 Encounter for screening for cardiovascular disorders: Secondary | ICD-10-CM | POA: Insufficient documentation

## 2022-11-09 ENCOUNTER — Telehealth: Payer: Self-pay | Admitting: Cardiology

## 2022-11-09 DIAGNOSIS — I2510 Atherosclerotic heart disease of native coronary artery without angina pectoris: Secondary | ICD-10-CM

## 2022-11-09 MED ORDER — ROSUVASTATIN CALCIUM 40 MG PO TABS: 40.0000 mg | ORAL_TABLET | Freq: Every day | ORAL | 3 refills | Status: DC

## 2022-11-09 NOTE — Telephone Encounter (Signed)
Pt returning call in regards to CT results

## 2022-11-09 NOTE — Telephone Encounter (Signed)
Spoke with pt, aware of CT results and medication recommendations. New script sent to the pharmacy and Lab orders mailed to the pt.

## 2023-01-06 ENCOUNTER — Other Ambulatory Visit: Payer: Self-pay | Admitting: Family Medicine

## 2023-01-06 DIAGNOSIS — Z139 Encounter for screening, unspecified: Secondary | ICD-10-CM

## 2023-01-07 ENCOUNTER — Ambulatory Visit
Admission: RE | Admit: 2023-01-07 | Discharge: 2023-01-07 | Disposition: A | Discharge status: Home / Self Care | Payer: Medicare Other | Source: Ambulatory Visit | Attending: Family Medicine | Admitting: Family Medicine

## 2023-01-07 DIAGNOSIS — Z139 Encounter for screening, unspecified: Secondary | ICD-10-CM

## 2023-01-09 LAB — LIPID PANEL
Chol/HDL Ratio: 3.2 ratio (ref 0.0–4.4)
Cholesterol, Total: 138 mg/dL (ref 100–199)
HDL: 43 mg/dL (ref >39)
LDL Chol Calc (NIH): 78 mg/dL (ref 0–99)
Triglycerides: 89 mg/dL (ref 0–149)
VLDL Cholesterol Cal: 17 mg/dL (ref 5–40)

## 2023-01-09 LAB — HEPATIC FUNCTION PANEL
ALT: 37 IU/L — ABNORMAL HIGH (ref 0–32)
AST: 29 IU/L (ref 0–40)
Albumin: 4.6 g/dL (ref 3.9–4.9)
Alkaline Phosphatase: 62 IU/L (ref 44–121)
Bilirubin Total: 0.4 mg/dL (ref 0.0–1.2)
Bilirubin, Direct: 0.1 mg/dL (ref 0.00–0.40)
Total Protein: 7 g/dL (ref 6.0–8.5)

## 2023-01-11 ENCOUNTER — Other Ambulatory Visit: Payer: Self-pay | Admitting: *Deleted

## 2023-01-11 DIAGNOSIS — I2510 Atherosclerotic heart disease of native coronary artery without angina pectoris: Secondary | ICD-10-CM

## 2023-01-19 ENCOUNTER — Telehealth: Payer: Self-pay | Admitting: Cardiology

## 2023-01-19 DIAGNOSIS — I2510 Atherosclerotic heart disease of native coronary artery without angina pectoris: Secondary | ICD-10-CM

## 2023-01-19 MED ORDER — ROSUVASTATIN CALCIUM 20 MG PO TABS: 20.0000 mg | ORAL_TABLET | Freq: Every day | ORAL | 3 refills | Status: DC

## 2023-01-19 MED ORDER — EZETIMIBE 10 MG PO TABS: 10.0000 mg | ORAL_TABLET | Freq: Every day | ORAL | 3 refills | Status: AC

## 2023-01-19 NOTE — Telephone Encounter (Signed)
Minimal elevation not unexpected but can decrease crestor to 20 mg daily; add zetia 10 mg daily (should not increase LFTs); check lipids and liver 8 weeks  [name]    Patient aware and verbalized understanding.   Rx sent to pharmacy and lab orders mailed

## 2023-01-19 NOTE — Telephone Encounter (Signed)
Patient is requesting a call back regarding lab results. She has concerns with her liver results and she would like to discuss the new medication being called in.

## 2023-01-19 NOTE — Telephone Encounter (Signed)
Gloria Perla, MD 01/11/2023  8:12 AM EDT     LDL not at goal; add zetia 10 mg daily; lipids and liver 8 weeks [name]   Returned call to patient-patient is concerned about liver function and additional medication.  She is concerned about her ALT level being elevated (ALT mildly elevated at 37).   No frequent tylenol use or frequent alcohol use.  She is wondering if the medication is "absolutely necessary" as she is trying to make diet changes and exercising 6 days a week.   If medication recommended, pharmacy is [facility] on [address].    Advised would confirm with MD and return call.

## 2023-03-23 LAB — LIPID PANEL
Chol/HDL Ratio: 2.5 ratio (ref 0.0–4.4)
Cholesterol, Total: 92 mg/dL — ABNORMAL LOW (ref 100–199)
HDL: 37 mg/dL — ABNORMAL LOW (ref >39)
LDL Chol Calc (NIH): 39 mg/dL (ref 0–99)
Triglycerides: 79 mg/dL (ref 0–149)
VLDL Cholesterol Cal: 16 mg/dL (ref 5–40)

## 2023-03-23 LAB — HEPATIC FUNCTION PANEL
ALT: 41 IU/L — ABNORMAL HIGH (ref 0–32)
AST: 31 IU/L (ref 0–40)
Albumin: 4.5 g/dL (ref 3.9–4.9)
Alkaline Phosphatase: 58 IU/L (ref 44–121)
Bilirubin Total: 0.4 mg/dL (ref 0.0–1.2)
Bilirubin, Direct: 0.12 mg/dL (ref 0.00–0.40)
Total Protein: 6.5 g/dL (ref 6.0–8.5)

## 2023-03-24 ENCOUNTER — Other Ambulatory Visit: Payer: Self-pay

## 2023-03-24 DIAGNOSIS — I2510 Atherosclerotic heart disease of native coronary artery without angina pectoris: Secondary | ICD-10-CM

## 2023-03-24 DIAGNOSIS — E785 Hyperlipidemia, unspecified: Secondary | ICD-10-CM

## 2023-03-25 ENCOUNTER — Other Ambulatory Visit: Payer: Self-pay

## 2023-03-25 MED ORDER — ROSUVASTATIN CALCIUM 10 MG PO TABS: 10.0000 mg | ORAL_TABLET | Freq: Every day | ORAL | 1 refills | Status: DC

## 2023-03-30 ENCOUNTER — Other Ambulatory Visit: Payer: Self-pay | Admitting: Family Medicine

## 2023-03-30 DIAGNOSIS — E2839 Other primary ovarian failure: Secondary | ICD-10-CM

## 2023-06-04 ENCOUNTER — Other Ambulatory Visit: Payer: Self-pay

## 2023-06-04 DIAGNOSIS — I2510 Atherosclerotic heart disease of native coronary artery without angina pectoris: Secondary | ICD-10-CM

## 2023-06-04 DIAGNOSIS — E785 Hyperlipidemia, unspecified: Secondary | ICD-10-CM

## 2023-06-04 LAB — HEPATIC FUNCTION PANEL
ALT: 41 IU/L — ABNORMAL HIGH (ref 0–32)
AST: 30 IU/L (ref 0–40)
Albumin: 4.5 g/dL (ref 3.9–4.9)
Alkaline Phosphatase: 56 IU/L (ref 44–121)
Bilirubin Total: 0.5 mg/dL (ref 0.0–1.2)
Bilirubin, Direct: 0.15 mg/dL (ref 0.00–0.40)
Total Protein: 6.4 g/dL (ref 6.0–8.5)

## 2023-06-04 LAB — LIPID PANEL
Chol/HDL Ratio: 2.7 ratio (ref 0.0–4.4)
Cholesterol, Total: 109 mg/dL (ref 100–199)
HDL: 41 mg/dL (ref >39)
LDL Chol Calc (NIH): 53 mg/dL (ref 0–99)
Triglycerides: 71 mg/dL (ref 0–149)
VLDL Cholesterol Cal: 15 mg/dL (ref 5–40)

## 2023-08-06 ENCOUNTER — Encounter: Payer: Medicare Other | Admitting: Nurse Practitioner

## 2023-08-06 ENCOUNTER — Other Ambulatory Visit: Payer: Medicare Other

## 2023-08-10 ENCOUNTER — Encounter: Payer: Self-pay | Admitting: Podiatry

## 2023-08-10 ENCOUNTER — Ambulatory Visit (INDEPENDENT_AMBULATORY_CARE_PROVIDER_SITE_OTHER): Payer: Medicare Other

## 2023-08-10 ENCOUNTER — Ambulatory Visit (INDEPENDENT_AMBULATORY_CARE_PROVIDER_SITE_OTHER): Payer: Medicare Other | Admitting: Podiatry

## 2023-08-10 DIAGNOSIS — M722 Plantar fascial fibromatosis: Secondary | ICD-10-CM | POA: Diagnosis not present

## 2023-08-10 DIAGNOSIS — M778 Other enthesopathies, not elsewhere classified: Secondary | ICD-10-CM

## 2023-08-10 MED ORDER — TRIAMCINOLONE ACETONIDE 10 MG/ML IJ SUSP
2.5000 mg | Freq: Once | INTRAMUSCULAR | Status: AC
  Administered 2023-08-10: 2.5 mg via INTRA_ARTICULAR

## 2023-08-10 MED ORDER — DEXAMETHASONE SODIUM PHOSPHATE 120 MG/30ML IJ SOLN
4.0000 mg | Freq: Once | INTRAMUSCULAR | Status: AC
  Administered 2023-08-10: 4 mg via INTRA_ARTICULAR

## 2023-08-10 NOTE — Progress Notes (Signed)
  Subjective:  Patient ID: Gloria Daugherty, female    DOB: 15-May-1956,   MRN: [id number]  Chief Complaint  Patient presents with   Foot Pain    RM#21 Left foot pain previous foot care on the right for plantar fasciitis patient states it is the same pain now on left x 6 weeks ago. No other concerns    67 y.o. female presents for concern as above. Relates had injection last time that really helped. Wants to stay acitve.  . Denies any other pedal complaints. Denies n/v/f/c.   Past Medical History:  Diagnosis Date   Hyperlipidemia    Hypertension    Menopause    Osteoporosis    Plantar fasciitis     Objective:  Physical Exam: Vascular: DP/PT pulses 2/4 bilateral. CFT <3 seconds. Normal hair growth on digits. No edema.  Skin. No lacerations or abrasions bilateral feet.  Musculoskeletal: MMT 5/5 bilateral lower extremities in DF, PF, Inversion and Eversion. Deceased ROM in DF of ankle joint. Tender to the medial calcaneal tubercle left . No pain with achilles, PT or arch. No pain with calcaneal squeeze.  Neurological: Sensation intact to light touch.   Assessment:   1. Plantar fasciitis, left      Plan:  Patient was evaluated and treated and all questions answered. Discussed plantar fasciitis with patient.  X-rays reviewed and discussed with patient. No acute fractures or dislocations noted. Mild spurring noted at inferior calcaneus.  Discussed treatment options including, ice, NSAIDS, supportive shoes, bracing, and stretching. Stretching exercises provided to be done on a daily basis.   Would like to avoid oral medications.  Patient requesting injection today. Procedure note below.   Follow-up 6 weeks or sooner if any problems arise. In the meantime, encouraged to call the office with any questions, concerns, change in symptoms.   Procedure:  Discussed etiology, pathology, conservative vs. surgical therapies. At this time a plantar fascial injection was recommended.  The  patient agreed and a sterile skin prep was applied.  An injection consisting of  1cc dexamethasone 0.5 cc kenalog and 1cc marcaine mixture was infiltrated at the point of maximal tenderness on the left Heel.  Bandaid applied. The patient tolerated this well and was given instructions for aftercare.    Louann Sjogren, DPM

## 2023-08-10 NOTE — Patient Instructions (Signed)

## 2023-08-18 ENCOUNTER — Other Ambulatory Visit: Payer: Self-pay | Admitting: Nurse Practitioner

## 2023-08-18 DIAGNOSIS — R7989 Other specified abnormal findings of blood chemistry: Secondary | ICD-10-CM

## 2023-08-18 NOTE — Progress Notes (Addendum)
New Hematology/Oncology Consult   Requesting MD: Jorge Ny, FNP  [phone]      Reason for Consult: Elevated B12 level  HPI: Gloria Daugherty is a 67 year old woman referred for evaluation of an elevated B12 level.  She had labs done on 03/22/2023 due to complaints of tiredness.  Serum folate returned at 19, vitamin B12 greater than 2000 ([phone]).  She was instructed to decrease the amount of B12 she was taking by half.  She had labs completed on 04/14/2023 including a CBC, chemistry panel and B12 level.  The CBC and chemistry panel were unremarkable, B12 level greater than 1501 (180-914).  Repeat B12 level on 06/02/2023 was elevated at greater than 1501 (180-914).   Past Medical History:  Diagnosis Date   Hyperlipidemia    Hypertension    Menopause    Osteoporosis    Plantar fasciitis      Past Surgical History:  Procedure Laterality Date   NASAL SEPTUM SURGERY  1996   TUBAL LIGATION       Current Outpatient Medications:    alendronate (FOSAMAX) 70 MG tablet, Take 70 mg by mouth once a week. Take with a full glass of water on an empty stomach., Disp: , Rfl:    amLODipine (NORVASC) 5 MG tablet, Take 5 mg by mouth daily., Disp: , Rfl:    CALCIUM PO, Take 1 tablet by mouth daily., Disp: , Rfl:    Cholecalciferol (VITAMIN D PO), Take 1 tablet by mouth daily., Disp: , Rfl:    ezetimibe (ZETIA) 10 MG tablet, Take 1 tablet (10 mg total) by mouth daily., Disp: 90 tablet, Rfl: 3   fluticasone (FLONASE) 50 MCG/ACT nasal spray, Place 2 sprays into both nostrils daily., Disp: 16 g, Rfl: 2   loratadine (CLARITIN) 10 MG tablet, Take 10 mg by mouth daily., Disp: , Rfl:    rosuvastatin (CRESTOR) 10 MG tablet, Take 1 tablet (10 mg total) by mouth daily., Disp: 90 tablet, Rfl: 1  Current Facility-Administered Medications:    triamcinolone acetonide (KENALOG) 10 MG/ML injection 10 mg, 10 mg, Other, Once, Stover, Whittlesey, DPM:   triamcinolone acetonide  10 mg Other Once     Allergies   Allergen Reactions   Codeine Other (See Comments)    Headache    FH: No family history of a blood disorder.  No family history of malignancy.  SOCIAL HISTORY: She lives in [place].  She is married.  She has 1 daughter.  She is retired, previously worked as a Engineer, building services.  No tobacco use.  Occasional alcohol intake.  Review of Systems: No fevers.  Periodic "hot flashes" at night.  She denies pain.  Energy level described as a 7 out of 10.  No pruritus after showering.  She has never had a blood clot, stroke, phlebitis.  No finger/toe pain or redness.  No nose or ear redness.  She has a good appetite.  Weight is stable.  She denies bleeding.  No unusual headaches.  No vision change.  No shortness of breath.  No cough.  No dysphagia.  No abdominal pain.  No change in bowel habits.  No urinary symptoms.  No numbness or tingling in the hands or feet.  Physical Exam:  Blood pressure 121/76, pulse 83, temperature 97.9 F (36.6 C), temperature source Temporal, resp. rate 18, height 5\' 3"  (1.6 m), weight 139 lb (63 kg), SpO2 98%.  HEENT: No thrush or ulcers. Lungs: Lungs clear bilaterally. Cardiac: Regular rate and rhythm. Abdomen: No hepatosplenomegaly Vascular:  No leg edema. Lymph nodes: No cervical, supraclavicular, axillary or inguinal lymph nodes. Neurologic: Alert and oriented. Skin: No rash.  LABS:   Recent Labs    08/19/23 1349  WBC 5.5  HGB 13.4  HCT 38.8  PLT 209  blood smear: The platelets appear normal in number, no platelet clumps.  The white cell morphology is unremarkable.  The majority the white cells are mature neutrophils and lymphocytes.  No blasts or other young forms are seen.  Red cell morphology is unremarkable   No results for input(s): "NA", "K", "CL", "CO2", "GLUCOSE", "BUN", "CREATININE", "CALCIUM" in the last 72 hours.    RADIOLOGY:  No results found.  Assessment and Plan:   Elevated B12  level Hypertension Hypercholesterolemia Osteoporosis  Gloria Daugherty was referred for evaluation of an elevated B12 level.  Etiology is unclear.  We obtained a repeat level in our office today with the result currently pending.  She does not have laboratory or clinical evidence to suggest a myeloproliferative disorder.  We will follow-up on the B12 level from today.  Follow-up to be determined.  Patient seen with Dr. Truett Perna.     Lonna Cobb, NP 08/19/2023, 3:14 PM  This was a shared visit with Lonna Cobb.  Gloria Daugherty was interviewed and examined.  She is referred for evaluation of an elevated vitamin B12 level.  There is no clinical evidence for a myeloproliferative disorder or other hematologic condition.  The CBC is normal.  We will follow-up on the vitamin B12 level from today.  She has no evidence of a systemic condition such as liver disease to account for the elevated vitamin B12 level.  I suspect this is a benign nonspecific finding.  I was present for greater than 50% of today's visit.  I performed Medical Decision Making.  Mancel Bale, MD

## 2023-08-19 ENCOUNTER — Inpatient Hospital Stay: Payer: Medicare Other | Attending: Oncology

## 2023-08-19 ENCOUNTER — Inpatient Hospital Stay: Payer: Medicare Other | Admitting: Nurse Practitioner

## 2023-08-19 ENCOUNTER — Encounter: Payer: Self-pay | Admitting: Nurse Practitioner

## 2023-08-19 VITALS — BP 121/76 | HR 83 | Temp 97.9°F | Resp 18 | Ht 63.0 in | Wt 139.0 lb

## 2023-08-19 DIAGNOSIS — I10 Essential (primary) hypertension: Secondary | ICD-10-CM | POA: Insufficient documentation

## 2023-08-19 DIAGNOSIS — E7800 Pure hypercholesterolemia, unspecified: Secondary | ICD-10-CM | POA: Insufficient documentation

## 2023-08-19 DIAGNOSIS — R7989 Other specified abnormal findings of blood chemistry: Secondary | ICD-10-CM | POA: Diagnosis not present

## 2023-08-19 DIAGNOSIS — Z79899 Other long term (current) drug therapy: Secondary | ICD-10-CM | POA: Insufficient documentation

## 2023-08-19 LAB — SAVE SMEAR(SSMR), FOR PROVIDER SLIDE REVIEW

## 2023-08-19 LAB — CBC WITH DIFFERENTIAL (CANCER CENTER ONLY)
Abs Immature Granulocytes: 0.01 10*3/uL (ref 0.00–0.07)
Basophils Absolute: 0 10*3/uL (ref 0.0–0.1)
Basophils Relative: 0 %
Eosinophils Absolute: 0.1 10*3/uL (ref 0.0–0.5)
Eosinophils Relative: 1 %
HCT: 38.8 % (ref 36.0–46.0)
Hemoglobin: 13.4 g/dL (ref 12.0–15.0)
Immature Granulocytes: 0 %
Lymphocytes Relative: 38 %
Lymphs Abs: 2.1 10*3/uL (ref 0.7–4.0)
MCH: 32.5 pg (ref 26.0–34.0)
MCHC: 34.5 g/dL (ref 30.0–36.0)
MCV: 94.2 fL (ref 80.0–100.0)
Monocytes Absolute: 0.3 10*3/uL (ref 0.1–1.0)
Monocytes Relative: 6 %
Neutro Abs: 3 10*3/uL (ref 1.7–7.7)
Neutrophils Relative %: 55 %
Platelet Count: 209 10*3/uL (ref 150–400)
RBC: 4.12 MIL/uL (ref 3.87–5.11)
RDW: 13.2 % (ref 11.5–15.5)
WBC Count: 5.5 10*3/uL (ref 4.0–10.5)
nRBC: 0 % (ref 0.0–0.2)

## 2023-08-19 LAB — VITAMIN B12: Vitamin B-12: 1275 pg/mL — ABNORMAL HIGH (ref 180–914)

## 2023-08-23 ENCOUNTER — Other Ambulatory Visit: Payer: Self-pay

## 2023-08-23 ENCOUNTER — Telehealth: Payer: Self-pay

## 2023-08-23 DIAGNOSIS — D649 Anemia, unspecified: Secondary | ICD-10-CM

## 2023-08-23 NOTE — Telephone Encounter (Signed)
Patient is schedule  and verbal understanding and had no further questions or concerns

## 2023-08-23 NOTE — Telephone Encounter (Signed)
-----   Message from Lonna Cobb sent at 08/20/2023  4:02 PM EDT ----- Please let her know the B12 level was lower but remains mildly elevated.  Please schedule follow-up appointment in 3 months with a B12 level 1 week prior to the office visit.  Thanks

## 2023-09-17 ENCOUNTER — Other Ambulatory Visit: Payer: Self-pay | Admitting: Cardiology

## 2023-09-28 ENCOUNTER — Ambulatory Visit: Payer: Medicare Other | Admitting: Podiatry

## 2023-10-25 ENCOUNTER — Other Ambulatory Visit: Payer: Medicare Other

## 2023-10-26 ENCOUNTER — Ambulatory Visit (INDEPENDENT_AMBULATORY_CARE_PROVIDER_SITE_OTHER): Payer: Medicare Other | Admitting: Podiatry

## 2023-10-26 DIAGNOSIS — M722 Plantar fascial fibromatosis: Secondary | ICD-10-CM | POA: Diagnosis not present

## 2023-10-26 MED ORDER — DEXAMETHASONE SODIUM PHOSPHATE 120 MG/30ML IJ SOLN
4.0000 mg | Freq: Once | INTRAMUSCULAR | Status: AC
  Administered 2023-10-26: 4 mg via INTRA_ARTICULAR

## 2023-10-26 MED ORDER — TRIAMCINOLONE ACETONIDE 10 MG/ML IJ SUSP
2.5000 mg | Freq: Once | INTRAMUSCULAR | Status: AC
  Administered 2023-10-26: 2.5 mg via INTRA_ARTICULAR

## 2023-10-26 NOTE — Progress Notes (Signed)
  Subjective:  Patient ID: Coleen Cardiff, female    DOB: December 20, 1955,   MRN: [id number]  No chief complaint on file.   68 y.o. female presents for follow-up of left plantar fasciitis. Relates the injection helped for a few days but then the pain returned. Mostly getting pain at night. She has been wearing the brace which helps. She does relates she has not been stretching.  Denies any other pedal complaints. Denies n/v/f/c.   Past Medical History:  Diagnosis Date   Hyperlipidemia    Hypertension    Menopause    Osteoporosis    Plantar fasciitis     Objective:  Physical Exam: Vascular: DP/PT pulses 2/4 bilateral. CFT <3 seconds. Normal hair growth on digits. No edema.  Skin. No lacerations or abrasions bilateral feet.  Musculoskeletal: MMT 5/5 bilateral lower extremities in DF, PF, Inversion and Eversion. Deceased ROM in DF of ankle joint. Tender to the medial calcaneal tubercle left . No pain with achilles, PT or arch. No pain with calcaneal squeeze.  Neurological: Sensation intact to light touch.   Assessment:   1. Plantar fasciitis, left       Plan:  Patient was evaluated and treated and all questions answered. Discussed plantar fasciitis with patient.  X-rays reviewed and discussed with patient. No acute fractures or dislocations noted. Mild spurring noted at inferior calcaneus.  Discussed treatment options including, ice, NSAIDS, supportive shoes, bracing, and stretching. Discussed trying to do more stretching at home.  Continue brace .  Patient requesting injection today. Procedure note below.   Follow-up 6 weeks or sooner if any problems arise. In the meantime, encouraged to call the office with any questions, concerns, change in symptoms.   Procedure:  Discussed etiology, pathology, conservative vs. surgical therapies. At this time a plantar fascial injection was recommended.  The patient agreed and a sterile skin prep was applied.  An injection consisting of   1cc dexamethasone  0.5 cc kenalog  and 1cc marcaine mixture was infiltrated at the point of maximal tenderness on the left Heel.  Bandaid applied. The patient tolerated this well and was given instructions for aftercare.    Asberry Failing, DPM

## 2023-10-26 NOTE — Patient Instructions (Signed)

## 2023-10-27 ENCOUNTER — Other Ambulatory Visit: Payer: Self-pay | Admitting: Family Medicine

## 2023-10-27 DIAGNOSIS — E2839 Other primary ovarian failure: Secondary | ICD-10-CM

## 2023-11-01 ENCOUNTER — Ambulatory Visit
Admission: RE | Admit: 2023-11-01 | Discharge: 2023-11-01 | Disposition: A | Discharge status: Home / Self Care | Payer: Medicare Other | Source: Ambulatory Visit | Attending: Family Medicine | Admitting: Family Medicine

## 2023-11-01 DIAGNOSIS — E2839 Other primary ovarian failure: Secondary | ICD-10-CM

## 2023-11-29 NOTE — Progress Notes (Deleted)
 HPI: FU abnormal ECG and FH of CAD.  Echocardiogram January 2024 showed normal LV function, small pericardial effusion.  Calcium score January 2024 2 which was 63rd percentile.  Since last seen,   Current Outpatient Medications  Medication Sig Dispense Refill   alendronate (FOSAMAX) 70 MG tablet Take 70 mg by mouth once a week. Take with a full glass of water on an empty stomach.     amLODipine (NORVASC) 5 MG tablet Take 5 mg by mouth daily.     CALCIUM PO Take 1 tablet by mouth daily.     Cholecalciferol (VITAMIN D PO) Take 1 tablet by mouth daily.     ezetimibe (ZETIA) 10 MG tablet Take 1 tablet (10 mg total) by mouth daily. 90 tablet 3   fluticasone (FLONASE) 50 MCG/ACT nasal spray Place 2 sprays into both nostrils daily. 16 g 2   loratadine (CLARITIN) 10 MG tablet Take 10 mg by mouth daily.     rosuvastatin (CRESTOR) 10 MG tablet TAKE 1 TABLET(10 MG) BY MOUTH DAILY 90 tablet 0   Current Facility-Administered Medications  Medication Dose Route Frequency Provider Last Rate Last Admin   triamcinolone acetonide (KENALOG) 10 MG/ML injection 10 mg  10 mg Other Once Asencion Islam, DPM         Past Medical History:  Diagnosis Date   Hyperlipidemia    Hypertension    Menopause    Osteoporosis    Plantar fasciitis     Past Surgical History:  Procedure Laterality Date   NASAL SEPTUM SURGERY  1996   TUBAL LIGATION      Social History   Socioeconomic History   Marital status: Married    Spouse name: Not on file   Number of children: 1   Years of education: Not on file   Highest education level: Not on file  Occupational History   Not on file  Tobacco Use   Smoking status: Never   Smokeless tobacco: Never  Vaping Use   Vaping status: Never Used  Substance and Sexual Activity   Alcohol use: Yes    Alcohol/week: 10.0 standard drinks of alcohol    Types: 10 Standard drinks or equivalent per week    Comment: Occasional   Drug use: No   Sexual activity: Yes     Partners: Male    Birth control/protection: Post-menopausal  Other Topics Concern   Not on file  Social History Narrative   Not on file   Social Drivers of Health   Financial Resource Strain: Low Risk  (08/19/2023)   Overall Financial Resource Strain (CARDIA)    Difficulty of Paying Living Expenses: Not hard at all  Food Insecurity: No Food Insecurity (08/19/2023)   Hunger Vital Sign    Worried About Running Out of Food in the Last Year: Never true    Ran Out of Food in the Last Year: Never true  Transportation Needs: No Transportation Needs (08/19/2023)   PRAPARE - Administrator, Civil Service (Medical): No    Lack of Transportation (Non-Medical): No  Physical Activity: Sufficiently Active (08/19/2023)   Exercise Vital Sign    Days of Exercise per Week: 5 days    Minutes of Exercise per Session: 50 min  Stress: No Stress Concern Present (08/19/2023)   [employer] of Occupational Health - Occupational Stress Questionnaire    Feeling of Stress : Only a little  Social Connections: Socially Integrated (08/19/2023)   Social Connection and Isolation Panel [NHANES]  Frequency of Communication with Friends and Family: More than three times a week    Frequency of Social Gatherings with Friends and Family: More than three times a week    Attends Religious Services: More than 4 times per year    Active Member of [employer] or Organizations: Yes    Attends Engineer, structural: More than 4 times per year    Marital Status: Married  Catering manager Violence: Not At Risk (08/19/2023)   Humiliation, Afraid, Rape, and Kick questionnaire    Fear of Current or Ex-Partner: No    Emotionally Abused: No    Physically Abused: No    Sexually Abused: No    Family History  Problem Relation Age of Onset   Diabetes Mother    Heart disease Father    Diabetes Sister    Diabetes Brother    Heart disease Brother    Diabetes Brother    Heart disease Brother    Diabetes  Brother    Breast cancer Neg Hx     ROS: no fevers or chills, productive cough, hemoptysis, dysphasia, odynophagia, melena, hematochezia, dysuria, hematuria, rash, seizure activity, orthopnea, PND, pedal edema, claudication. Remaining systems are negative.  Physical Exam: Well-developed well-nourished in no acute distress.  Skin is warm and dry.  HEENT is normal.  Neck is supple.  Chest is clear to auscultation with normal expansion.  Cardiovascular exam is regular rate and rhythm.  Abdominal exam nontender or distended. No masses palpated. Extremities show no edema. neuro grossly intact  ECG- personally reviewed  A/P  1 mildly elevated calcium score-continue statin.  Patient denies chest pain.  2 hypertension-blood pressure controlled.  Continue present medical regimen.  Olga Millers, MD

## 2023-12-01 ENCOUNTER — Inpatient Hospital Stay: Payer: Medicare Other | Attending: Nurse Practitioner

## 2023-12-01 ENCOUNTER — Other Ambulatory Visit: Payer: Medicare Other

## 2023-12-01 DIAGNOSIS — R7989 Other specified abnormal findings of blood chemistry: Secondary | ICD-10-CM | POA: Insufficient documentation

## 2023-12-01 DIAGNOSIS — D649 Anemia, unspecified: Secondary | ICD-10-CM

## 2023-12-01 LAB — VITAMIN B12: Vitamin B-12: 1033 pg/mL — ABNORMAL HIGH (ref 180–914)

## 2023-12-08 ENCOUNTER — Encounter: Payer: Self-pay | Admitting: Nurse Practitioner

## 2023-12-08 ENCOUNTER — Inpatient Hospital Stay (HOSPITAL_BASED_OUTPATIENT_CLINIC_OR_DEPARTMENT_OTHER): Payer: Medicare Other | Admitting: Nurse Practitioner

## 2023-12-08 ENCOUNTER — Inpatient Hospital Stay: Payer: Medicare Other | Admitting: Nurse Practitioner

## 2023-12-08 VITALS — BP 133/78 | HR 74 | Temp 98.1°F | Resp 20 | Ht 63.0 in | Wt 142.5 lb

## 2023-12-08 DIAGNOSIS — R7989 Other specified abnormal findings of blood chemistry: Secondary | ICD-10-CM

## 2023-12-08 NOTE — Progress Notes (Signed)
  [facility] OFFICE PROGRESS NOTE   Diagnosis: Elevated B12 level  INTERVAL HISTORY:   Ms. Friberg returns as scheduled.  We are following her for an elevated B12 level.  She was initially seen 08/19/2023.  B12 level at that time was elevated at 1275.  Repeat B12 level last week returned at 1033 (180-914).  She feels well.  Good appetite.  Good energy level.  No fevers or sweats.  She reports discontinuing a multivitamin that included B12 when she was found to have the first elevated B12 level in June 2024.  Objective:  Vital signs in last 24 hours:  Blood pressure 133/78, pulse 74, temperature 98.1 F (36.7 C), temperature source Temporal, resp. rate 20, height 5\' 3"  (1.6 m), weight 142 lb 8 oz (64.6 kg), SpO2 98%.    HEENT: No thrush or ulcers. Lymphatics: No palpable cervical, supraclavicular, axillary or inguinal lymph nodes.  Resp: Lungs clear bilaterally. Cardio: Regular rate and rhythm. GI: No hepatosplenomegaly. Vascular: No leg edema.  Lab Results:  Lab Results  Component Value Date   WBC 5.5 08/19/2023   HGB 13.4 08/19/2023   HCT 38.8 08/19/2023   MCV 94.2 08/19/2023   PLT 209 08/19/2023   NEUTROABS 3.0 08/19/2023    Imaging:  No results found.  Medications: I have reviewed the patient's current medications.  Assessment/Plan: Elevated B12 level 03/22/2003 B12 greater than 2000 Multivitamin with B12 discontinued 04/14/2023 B12 greater than 1501 06/02/2023 B12 greater than 1501 08/19/2023 B12 1275 11/30/2022 B12 1033 Hypertension Hypercholesterolemia Osteoporosis  Disposition: Ms. Morgenthaler appears stable.  We reviewed the recent B12 level which is further improved, now mildly elevated.  She will remain off of vitamin/supplements containing B12.  She will return for lab and follow-up in 6 months.    Lonna Cobb ANP/GNP-BC   12/08/2023  11:14 AM

## 2023-12-09 ENCOUNTER — Ambulatory Visit: Payer: Medicare Other | Admitting: Cardiology

## 2023-12-13 ENCOUNTER — Ambulatory Visit: Payer: Medicare Other | Admitting: Podiatry

## 2023-12-16 ENCOUNTER — Other Ambulatory Visit: Payer: Self-pay | Admitting: Family Medicine

## 2023-12-16 DIAGNOSIS — Z1231 Encounter for screening mammogram for malignant neoplasm of breast: Secondary | ICD-10-CM

## 2024-01-13 ENCOUNTER — Ambulatory Visit: Payer: Medicare Other

## 2024-02-01 ENCOUNTER — Ambulatory Visit
Admission: RE | Admit: 2024-02-01 | Discharge: 2024-02-01 | Disposition: A | Discharge status: Home / Self Care | Source: Ambulatory Visit | Attending: Family Medicine

## 2024-02-01 DIAGNOSIS — Z1231 Encounter for screening mammogram for malignant neoplasm of breast: Secondary | ICD-10-CM

## 2024-02-16 ENCOUNTER — Other Ambulatory Visit: Payer: Self-pay

## 2024-02-16 ENCOUNTER — Encounter: Payer: Self-pay | Admitting: Physical Therapy

## 2024-02-16 ENCOUNTER — Ambulatory Visit: Attending: Internal Medicine | Admitting: Physical Therapy

## 2024-02-16 DIAGNOSIS — R2681 Unsteadiness on feet: Secondary | ICD-10-CM | POA: Diagnosis present

## 2024-02-16 DIAGNOSIS — M5459 Other low back pain: Secondary | ICD-10-CM | POA: Diagnosis present

## 2024-02-16 DIAGNOSIS — R252 Cramp and spasm: Secondary | ICD-10-CM | POA: Diagnosis present

## 2024-02-16 DIAGNOSIS — R29898 Other symptoms and signs involving the musculoskeletal system: Secondary | ICD-10-CM | POA: Insufficient documentation

## 2024-02-16 NOTE — Patient Instructions (Signed)
DO's and DON'T's   Avoid and/or Minimize positions of forward bending ( flexion)  Side bending and rotation of the trunk  Especially when movements occur together   When your back aches:   Don't sit down   Lie down on your back with a small pillow under your head and one under your knees or as outlined by our therapist. Or, lie in the 90/90 position ( on the floor with your feet and legs on the sofa with knees and hips bent to 90 degrees)  Tying or putting on your shoes:   Don't bend over to tie your shoes or put on socks.  Instead, bring one foot up, cross it over the opposite knee and bend forward (hinge) at the hips to so the task.  Keep your back straight.  If you cannot do this safely, then you need to use long handled assistive devices such as a shoehorn and sock puller.  Exercising:  Don't engage in ballistic types of exercise routines such as high-impact aerobics or jumping rope  Don't do exercises in the gym that bring you forward (abdominal crunches, sit-ups, touching your  toes, knee-to-chest, straight leg raising.)  Follow a regular exercise program that includes a variety of different weight-bearing activities, such as low-impact aerobics, T' ai chi or walking as your physical therapist advises  Do exercises that emphasize return to normal body alignment and strengthening of the muscles that keep your back straight, as outlined in this program or by your therapist  Household tasks:  Don't reach unnecessarily or twist your trunk when mopping, sweeping, vacuuming, raking, making beds, weeding gardens, getting objects ou of cupboards, etc.  Keep your broom, mop, vacuum, or rake close to you and mover your whole body as you move them. Walk over to the area on which you are working. Arrange kitchen, bathroom, and bedroom shelves so that frequently used items may be reached without excessive bending, twisting, and reaching.  Use a  sturdy stool if necessary.  Don't bend from the waist to pick up something up  Off the floor, out of the trunk of your car, or to brush your teeth, wash your face, etc.   Bend at the knees, keeping back straight as possible. Use a reacher if necessary.   Prevention of fracture is the so-called "BOTTOm -Line" in the management of OSTEOPOROSIS. Do not take unnecessary chances in movement. Once a compression fracture occurs, the process is very difficult to control; one fracture is frequently followed by many more.   

## 2024-02-16 NOTE — Therapy (Signed)
 OUTPATIENT PHYSICAL THERAPY THORACOLUMBAR EVALUATION   Patient Name: Gloria Daugherty MRN: [id number] DOB:June 28, 1956, 68 y.o., female Today's Date: 02/16/2024  END OF SESSION:  PT End of Session - 02/16/24 1533     Visit Number 1    Date for PT Re-Evaluation 03/29/24    Authorization Type MCR    Progress Note Due on Visit 10    PT Start Time 1533    PT Stop Time 1614    PT Time Calculation (min) 41 min    Activity Tolerance Patient tolerated treatment well    Behavior During Therapy [facility] for tasks assessed/performed             Past Medical History:  Diagnosis Date   Hyperlipidemia    Hypertension    Menopause    Osteoporosis    Plantar fasciitis    Past Surgical History:  Procedure Laterality Date   NASAL SEPTUM SURGERY  1996   TUBAL LIGATION     Patient Active Problem List   Diagnosis Date Noted   Osteopenia  Dexa done 2016  12/17/2014   Osteoporosis 09/05/2014   Vaginal dryness, menopausal 09/05/2014   Dyspareunia 09/05/2014   Hyperlipidemia 09/05/2014    PCP: Karlton Overly, DO   REFERRING PROVIDER: Gordy Lauber, MD   REFERRING DIAG: M81.0 (ICD-10-CM) - Age-related osteoporosis without current pathological fracture   Rationale for Evaluation and Treatment: Rehabilitation  THERAPY DIAG:  Other symptoms and signs involving the musculoskeletal system - Plan: PT plan of care cert/re-cert  Unsteadiness on feet - Plan: PT plan of care cert/re-cert  Other low back pain - Plan: PT plan of care cert/re-cert  Cramp and spasm - Plan: PT plan of care cert/re-cert  ONSET DATE: 2015  SUBJECTIVE:                                                                                                                                                                                           SUBJECTIVE STATEMENT: Have had OP for a while and have been on meds. Saw endocrinologist and he says that the meds have not improved her OP much so he wants her to try  PT. Patient walks and does cardio-dance. She would like to return to cardiofit using light weight and dumbbells. When I walk my low back hurts sometimes. I wear a weighted vest 12# every other walk which doesn't change the back pain. I'm trying to stay as healthy as I can be as long as I can. She fell a year ago February onto her hands and she still has ongoing hand pain and a little shoulder pain. Wants to work on balance.  PERTINENT HISTORY:   HTN  PAIN:  Are you having pain? Yes: NPRS scale: 3/10 Pain location: low back Pain description: stiffness and ache Aggravating factors: walking or or prolonged sitting Relieving factors: heat if really bad but usually just resolves  PRECAUTIONS: Other: Osteoporosis in lumbar and radius  RED FLAGS: None   WEIGHT BEARING RESTRICTIONS: No  FALLS:  Has patient fallen in last 6 months? No  LIVING ENVIRONMENT: Lives with: lives with their spouse Lives in: House/apartment Stairs: Yes: External: 6 steps; can reach both Has following equipment at home: None  OCCUPATION: retired  PLOF: Independent and Leisure: exercises, works in the garden, cooking  PATIENT GOALS: get stronger  NEXT MD VISIT: none scheduled  OBJECTIVE:  Note: Objective measures were completed at Evaluation unless otherwise noted.  DIAGNOSTIC FINDINGS:  ASSESSMENT: The BMD measured at AP Spine L2-L3 is 0.868 g/cm2 with a T-score of -2.8. This patient is considered osteoporotic according to World Health Organization [facility]) criteria.   L1 & L4 were excluded due to degenerative changes. The quality of the exam is good.   Site Region Measured Date Measured Age YA BMD Significant CHANGE T-score AP Spine L2-L3 11/01/2023 66.4 -2.8 0.868 g/cm2   DualFemur Neck Left 11/01/2023 66.4 -0.5 0.963 g/cm2   DualFemur Total Mean 11/01/2023 66.4 0.7 1.102 g/cm2   Left Forearm Radius 33% 11/01/2023 66.4 -2.7 0.637 g/cm2  PATIENT SURVEYS:  The Patient-Specific Functional  Scale  Initial:  I am going to ask you to identify up to 3 important activities that you are unable to do or are having difficulty with as a result of this problem.  Today are there any activities that you are unable to do or having difficulty with because of this?  (Patient shown scale and patient rated each activity)  Follow up: When you first came in you had difficulty performing these activities.  Today do you still have difficulty?  Patient-Specific activity scoring scheme (Point to one number):  0 1 2 3 4 5 6 7 8 9  10 Unable                                                                                                          Able to perform To perform                                                                                                    activity at the same Activity         Level as before  Injury or problem  Activity         Walking                                                                 Initial:        8               follow up:  2.        Sitting on the airplane                                                                          Initial:        5               follow up:  3.                                                                                  Initial:                       follow up:     COGNITION: Overall cognitive status: Within functional limits for tasks assessed     SENSATION: WFL  MUSCLE LENGTH: Tight HS and piriformis B R>L  POSTURE: No Significant postural limitations  PALPATION: B gluteals marked TTP  LUMBAR ROM: WNL  LOWER EXTREMITY ROM:   WNL B  LOWER EXTREMITY MMT:  5/5 except  MMT Right eval Left eval  Hip flexion    Hip extension 4 4+  Hip abduction    Hip adduction    Hip internal rotation    Hip external rotation    Knee flexion    Knee extension    Ankle dorsiflexion    Ankle  plantarflexion    Ankle inversion    Ankle eversion     (Blank rows = not tested)   FUNCTIONAL TESTS:  5 times sit to stand: 13.72 SLS x 10 sec B Functional squat - good form - some increased trunk bend     TREATMENT DATE:  02/16/24  See pt ed and HEP   PATIENT EDUCATION:  Education details: PT eval findings, anticipated POC, initial HEP, and educated on do's and don'ts of OP, avoiding flexion and twisting activities, Do it right and prevent fractures, log roll, some ADL modifications reviewed and importance of neutral spine stressed.   Person educated: Patient Education method: Explanation, Demonstration, and Handouts Education comprehension: verbalized understanding and returned demonstration  HOME EXERCISE PROGRAM: Access Code: [email or web address] URL: [email or web address] Date: 02/16/2024 Prepared by: Concha Deed  Exercises - Seated Piriformis Stretch with Trunk Bend  - 2 x daily - 7 x weekly - 1 sets - 3 reps - 30-60 sec  hold  Patient Education - Do it right & prevent fractures - Osteoporosis education - Posture and Body Mechanics  ASSESSMENT:  CLINICAL IMPRESSION: Patient is a 68 y.o. female who was seen today for physical therapy evaluation and treatment for strengthening due to her osteoporosis diagnosis. She also reports low back pain and balance issues. She demonstrates deficits in education regarding her dx, LE flexibility and strength. She reports balance issues, but denies falls in the past 6 months. She does report unsteadiness at times with ADLS. Her back pain primarily occurs with walking, but is also present after prolonged sitting mostly on airplanes, which she is on frequently. She will benefit from skilled PT to address these deficits.    OBJECTIVE IMPAIRMENTS: decreased activity tolerance, decreased balance, decreased knowledge of  condition, decreased strength, increased muscle spasms, impaired flexibility, improper body mechanics, and pain.   ACTIVITY LIMITATIONS: transfers and locomotion level  PARTICIPATION LIMITATIONS:  not limited but painful  PERSONAL FACTORS: Age, Time since onset of injury/illness/exacerbation, and 1-2 comorbidities: DJD spine, unsteadiness  are also affecting patient's functional outcome.   REHAB POTENTIAL: Excellent  CLINICAL DECISION MAKING: Evolving/moderate complexity  EVALUATION COMPLEXITY: Low   GOALS: Goals reviewed with patient? Yes  SHORT TERM GOALS: Target date: 03/08/24  Balance assessed for impairments and goals set. Baseline: Goal status: INITIAL  2.  Patient able to verbalize do's and don'ts of OP Baseline:  Goal status: INITIAL  3.  Ind with initial HEP Baseline:  Goal status: INITIAL   LONG TERM GOALS: Target date: 03/29/24  Ind with advanced HEP to prevent injury and maintain gains made in PT Baseline:  Goal status: INITIAL  2.  Improved PSFS by 2-3 points   Baseline:  Goal status: INITIAL  3.  Decreased pain in low back by 50% or more with walking and prolonged sitting. Baseline:  Goal status: INITIAL  4.  Balance goal TBD Baseline:  Goal status: INITIAL   PLAN:  PT FREQUENCY: 1x/week  PT DURATION: 6 weeks  PLANNED INTERVENTIONS: 97164- PT Re-evaluation, 97110-Therapeutic exercises, 97530- Therapeutic activity, 97112- Neuromuscular re-education, 97535- Self Care, 16109- Manual therapy, Patient/Family education, Balance training, Taping, Dry Needling, Joint mobilization, Spinal mobilization, Cryotherapy, and Moist heat.  PLAN FOR NEXT SESSION:  Review handouts on OP. Assess balance and set goal if appropriate, work on weightbearing functional exercises ADL modification. Progress HEP with WB exercises.   Jinx Mourning, PT  02/16/2024, 5:25 PM

## 2024-02-29 NOTE — Therapy (Signed)
 OUTPATIENT PHYSICAL THERAPY THORACOLUMBAR TREATMENT   Patient Name: Gloria Daugherty MRN: [id number] DOB:16-Jul-1956, 68 y.o., female Today's Date: 03/01/2024  END OF SESSION:  PT End of Session - 03/01/24 1448     Visit Number 2    Date for PT Re-Evaluation 03/29/24    Authorization Type MCR    Progress Note Due on Visit 10    PT Start Time 1452    PT Stop Time 1534    PT Time Calculation (min) 42 min    Activity Tolerance Patient tolerated treatment well    Behavior During Therapy [facility] for tasks assessed/performed              Past Medical History:  Diagnosis Date   Hyperlipidemia    Hypertension    Menopause    Osteoporosis    Plantar fasciitis    Past Surgical History:  Procedure Laterality Date   NASAL SEPTUM SURGERY  1996   TUBAL LIGATION     Patient Active Problem List   Diagnosis Date Noted   Osteopenia  Dexa done 2016  12/17/2014   Osteoporosis 09/05/2014   Vaginal dryness, menopausal 09/05/2014   Dyspareunia 09/05/2014   Hyperlipidemia 09/05/2014    PCP: Karlton Overly, DO   REFERRING PROVIDER: Gordy Lauber, MD   REFERRING DIAG: M81.0 (ICD-10-CM) - Age-related osteoporosis without current pathological fracture   Rationale for Evaluation and Treatment: Rehabilitation  THERAPY DIAG:  Other symptoms and signs involving the musculoskeletal system  Unsteadiness on feet  Other low back pain  Cramp and spasm  ONSET DATE: 2015  SUBJECTIVE:                                                                                                                                                                                           SUBJECTIVE STATEMENT: I did fly over the weekend and then Monday was achy. Yesterday was good.     Eval: Have had OP for a while and have been on meds. Saw endocrinologist and he says that the meds have not improved her OP much so he wants her to try PT. Patient walks and does cardio-dance. She would like to  return to cardiofit using light weight and dumbbells. When I walk my low back hurts sometimes. I wear a weighted vest 12# every other walk which doesn't change the back pain. I'm trying to stay as healthy as I can be as long as I can. She fell a year ago February onto her hands and she still has ongoing hand pain and a little shoulder pain. Wants to work on balance.  PERTINENT HISTORY:   HTN, vertigo  PAIN:  Are you having pain? Yes: NPRS scale: 0/10 Pain location: low back Pain description: stiffness and ache Aggravating factors: walking or or prolonged sitting Relieving factors: heat if really bad but usually just resolves  PRECAUTIONS: Other: Osteoporosis in lumbar and radius  RED FLAGS: None   WEIGHT BEARING RESTRICTIONS: No  FALLS:  Has patient fallen in last 6 months? No  LIVING ENVIRONMENT: Lives with: lives with their spouse Lives in: House/apartment Stairs: Yes: External: 6 steps; can reach both Has following equipment at home: None  OCCUPATION: retired  PLOF: Independent and Leisure: exercises, works in the garden, cooking  PATIENT GOALS: get stronger  NEXT MD VISIT: none scheduled  OBJECTIVE:  Note: Objective measures were completed at Evaluation unless otherwise noted.  DIAGNOSTIC FINDINGS:  ASSESSMENT: The BMD measured at AP Spine L2-L3 is 0.868 g/cm2 with a T-score of -2.8. This patient is considered osteoporotic according to World Health Organization [facility]) criteria.   L1 & L4 were excluded due to degenerative changes. The quality of the exam is good.   Site Region Measured Date Measured Age YA BMD Significant CHANGE T-score AP Spine L2-L3 11/01/2023 66.4 -2.8 0.868 g/cm2   DualFemur Neck Left 11/01/2023 66.4 -0.5 0.963 g/cm2   DualFemur Total Mean 11/01/2023 66.4 0.7 1.102 g/cm2   Left Forearm Radius 33% 11/01/2023 66.4 -2.7 0.637 g/cm2  PATIENT SURVEYS:  The Patient-Specific Functional Scale  Initial:  I am going to ask you to identify up  to 3 important activities that you are unable to do or are having difficulty with as a result of this problem.  Today are there any activities that you are unable to do or having difficulty with because of this?  (Patient shown scale and patient rated each activity)  Follow up: When you first came in you had difficulty performing these activities.  Today do you still have difficulty?  Patient-Specific activity scoring scheme (Point to one number):  0 1 2 3 4 5 6 7 8 9  10 Unable                                                                                                          Able to perform To perform                                                                                                    activity at the same Activity         Level as before  Injury or problem  Activity         Walking                                                                 Initial:        8               follow up:  2.        Sitting on the airplane                                                                          Initial:        5               follow up:  3.                                                                                  Initial:                       follow up:     COGNITION: Overall cognitive status: Within functional limits for tasks assessed     SENSATION: WFL  MUSCLE LENGTH: Tight HS and piriformis B R>L  POSTURE: No Significant postural limitations  PALPATION: B gluteals marked TTP  LUMBAR ROM: WNL  LOWER EXTREMITY ROM:   WNL B  LOWER EXTREMITY MMT:  5/5 except  MMT Right eval Left eval  Hip flexion    Hip extension 4 4+  Hip abduction    Hip adduction    Hip internal rotation    Hip external rotation    Knee flexion    Knee extension    Ankle dorsiflexion    Ankle plantarflexion    Ankle inversion    Ankle eversion      (Blank rows = not tested)   FUNCTIONAL TESTS:  5 times sit to stand: 13.72 Dynamic Gait Index: 23/24 completed 03/01/24 MCTSIB: Condition 1: Avg of 3 trials: 30 sec, Condition 2: Avg of 3 trials: 30 sec, Condition 3: Avg of 3 trials: 30 sec, Condition 4: Avg of 3 trials: 25 sec, and Total Score: 115/120 03/01/24 SLS x 10 sec B Functional squat - good form - some increased trunk bend   Tandem stance 30 sec B 03/01/24     TREATMENT DATE:  03/01/24 Nustep L5 x 5 min - PT present to discuss status Seated fig 4 2x30 sec B Balance assessment: DGI, MCTSIB, tandem stance Functional squat x 10, with 10# x 10, with 10# B hands x 10 Standing Biceps curl to OH press 5# ea hand 2 x 10 - cues to avoid swinging Plank 3 x max hold  TA contraction + alt march x 10 ea TA contraction + heel slide x 10 ea TA + sequential march x 5 B 90/90 + heel tap x 10 B   02/16/24  See pt ed and HEP   PATIENT EDUCATION:  Education details: PT eval findings, anticipated POC, initial HEP, and educated on do's and don'ts of OP, avoiding flexion and twisting activities, Do it right and prevent fractures, log roll, some ADL modifications reviewed and importance of neutral spine stressed.   Person educated: Patient Education method: Explanation, Demonstration, and Handouts Education comprehension: verbalized understanding and returned demonstration  HOME EXERCISE PROGRAM: Access Code: [email or web address] URL: [email or web address] Date: 03/01/2024 Prepared by: Concha Deed  Exercises - Seated Piriformis Stretch with Trunk Bend  - 2 x daily - 7 x weekly - 1 sets - 3 reps - 30-60 sec  hold - Standard Plank  - 2 x daily - 7 x weekly - 1 sets - 5 reps - max hold - Supine Dead Bug with Leg Extension  - 1 x daily - 3 x weekly - 2 sets - 10 reps  Patient Education - Do it right & prevent fractures  - Osteoporosis education - Posture and Body Mechanics  ASSESSMENT:  CLINICAL IMPRESSION: Patient reports no pain today. She did have increased LBP after flying last weekend. Balance assessed. No significant problems except with eyes closed on compliant surface. She did not have any questions regarding OP material and ADLS handouts from last visit. Functional strengthening initiated. She is challenged with dying bug and planks for core strength and these were added to HEP.   Eval: Patient is a 68 y.o. female who was seen today for physical therapy evaluation and treatment for strengthening due to her osteoporosis diagnosis. She also reports low back pain and balance issues. She demonstrates deficits in education regarding her dx, LE flexibility and strength. She reports balance issues, but denies falls in the past 6 months. She does report unsteadiness at times with ADLS. Her back pain primarily occurs with walking, but is also present after prolonged sitting mostly on airplanes, which she is on frequently. She will benefit from skilled PT to address these deficits.    OBJECTIVE IMPAIRMENTS: decreased activity tolerance, decreased balance, decreased knowledge of condition, decreased strength, increased muscle spasms, impaired flexibility, improper body mechanics, and pain.   ACTIVITY LIMITATIONS: transfers and locomotion level  PARTICIPATION LIMITATIONS: not limited but painful  PERSONAL FACTORS: Age, Time since onset of injury/illness/exacerbation, and 1-2 comorbidities: DJD spine, unsteadiness are also affecting patient's functional outcome.   REHAB POTENTIAL: Excellent  CLINICAL DECISION MAKING: Evolving/moderate complexity  EVALUATION COMPLEXITY: Low   GOALS: Goals reviewed with patient? Yes  SHORT TERM GOALS: Target date: 03/08/24  Balance assessed for impairments and goals set. Baseline: Goal status: MET  2.  Patient able to verbalize do's and don'ts of OP Baseline:  Goal  status: INITIAL  3.  Ind with initial HEP Baseline:  Goal status: INITIAL   LONG TERM GOALS: Target date: 03/29/24  Ind with advanced HEP to prevent injury and maintain gains made in PT Baseline:  Goal status: INITIAL  2.  Improved PSFS by  2-3 points   Baseline:  Goal status: INITIAL  3.  Decreased pain in low back by 50% or more with walking and prolonged sitting. Baseline:  Goal status: INITIAL  4.  Improved MCTSIB to 120 showing improvement in balance Baseline: 1115 Goal status: INITIAL   PLAN:  PT FREQUENCY: 1x/week  PT DURATION: 6 weeks  PLANNED INTERVENTIONS: 97164- PT Re-evaluation, 97110-Therapeutic exercises, 97530- Therapeutic activity, 97112- Neuromuscular re-education, 97535- Self Care, 60454- Manual therapy, Patient/Family education, Balance training, Taping, Dry Needling, Joint mobilization, Spinal mobilization, Cryotherapy, and Moist heat.  PLAN FOR NEXT SESSION:  work on weightbearing functional exercises upper and lower; divers for balance and compliant surface activities. Jinx Mourning, PT  03/01/2024, 5:29 PM

## 2024-03-01 ENCOUNTER — Ambulatory Visit: Attending: Internal Medicine | Admitting: Physical Therapy

## 2024-03-01 ENCOUNTER — Encounter: Payer: Self-pay | Admitting: Physical Therapy

## 2024-03-01 DIAGNOSIS — M5459 Other low back pain: Secondary | ICD-10-CM | POA: Insufficient documentation

## 2024-03-01 DIAGNOSIS — R29898 Other symptoms and signs involving the musculoskeletal system: Secondary | ICD-10-CM | POA: Diagnosis present

## 2024-03-01 DIAGNOSIS — R252 Cramp and spasm: Secondary | ICD-10-CM | POA: Diagnosis present

## 2024-03-01 DIAGNOSIS — R2681 Unsteadiness on feet: Secondary | ICD-10-CM | POA: Insufficient documentation

## 2024-03-08 ENCOUNTER — Ambulatory Visit: Admitting: Physical Therapy

## 2024-03-08 DIAGNOSIS — R29898 Other symptoms and signs involving the musculoskeletal system: Secondary | ICD-10-CM

## 2024-03-08 DIAGNOSIS — R2681 Unsteadiness on feet: Secondary | ICD-10-CM

## 2024-03-08 DIAGNOSIS — M5459 Other low back pain: Secondary | ICD-10-CM

## 2024-03-08 NOTE — Patient Instructions (Signed)
 OSTEOPOROSIS   Significant research has been done over the last few years that have made great strides in osteoporosis recommendations.  A few key concepts about bone loading: 1) be specific and target those areas of lowest T scores like the femoral neck or lumbar spine 2) be progressive and patient. It takes 6-8 months to make a change in bones 3)  the areas that have the worst T score benefit the most and the biggest improvements are seen! 4)  Even if you stop exercise, there will be some decline although some benefits will maintain  POWER LIFTING: One study called the LIFTMOR study really expands on the benefit of  lifting heavier loads:  dead lifting, carries   PROGRESSIVE RESISTANCE TRAINING: can be your own body weight against gravity, elastic bands or weights for resistance.  A basic muscle strengthening program can include:   Squat, lunge, hinge or bridge exercises to improve leg strength Push, pull and press exercises for upper body and shoulder muscles such as pull downs, rows and counter or floor push ups Planks, side planks and bird dog exercises to target abdominal and back extensor muscles and improve posture   IMPACT EXERCISE:  a study in 2019 measured ground reactive forces relative to bodyweight to find which exercises stimulated the bone the most.  Some of the most surprisingly high beneficial forces are: Heel drops Hopping Foot Stomping Side to side jump over rope   BALANCE:  balance exercises involve staying steady during movements that make you unstable. You should practice: Leaning forward, backward or side to side Unusual walking or dance patterns, such as walking heel to toe or sideways or using an agility ladder Reacting to things that upset your balance, like stopping or changing directions Tai Chi  Practice exercises that improve functional abilities such as: Sit to  stands or squats Stair climbing or toe taps on a step   Aim for 2-3 times per week and increase difficulty over time.

## 2024-03-08 NOTE — Therapy (Signed)
 OUTPATIENT PHYSICAL THERAPY THORACOLUMBAR TREATMENT   Patient Name: Gloria Daugherty MRN: [id number] DOB:October 18, 1956, 68 y.o., female Today's Date: 03/08/2024  END OF SESSION:  PT End of Session - 03/08/24 1401     Visit Number 3    Date for PT Re-Evaluation 03/29/24    Authorization Type MCR    Progress Note Due on Visit 10    PT Start Time 1401    PT Stop Time 1444    PT Time Calculation (min) 43 min    Activity Tolerance Patient tolerated treatment well              Past Medical History:  Diagnosis Date   Hyperlipidemia    Hypertension    Menopause    Osteoporosis    Plantar fasciitis    Past Surgical History:  Procedure Laterality Date   NASAL SEPTUM SURGERY  1996   TUBAL LIGATION     Patient Active Problem List   Diagnosis Date Noted   Osteopenia  Dexa done 2016  12/17/2014   Osteoporosis 09/05/2014   Vaginal dryness, menopausal 09/05/2014   Dyspareunia 09/05/2014   Hyperlipidemia 09/05/2014    PCP: Karlton Overly, DO   REFERRING PROVIDER: Gordy Lauber, MD   REFERRING DIAG: M81.0 (ICD-10-CM) - Age-related osteoporosis without current pathological fracture   Rationale for Evaluation and Treatment: Rehabilitation  THERAPY DIAG:  Other symptoms and signs involving the musculoskeletal system  Unsteadiness on feet  Other low back pain  ONSET DATE: 2015  SUBJECTIVE:                                                                                                                                                                                           SUBJECTIVE STATEMENT: Stiffness in low back rather than pain.  I feel it when I walk sometimes.    Ex's went fine but b/c was out of town I haven't had time to do them.    Eval: Have had OP for a while and have been on meds. Saw endocrinologist and he says that the meds have not improved her OP much so he wants her to try PT. Patient walks and does cardio-dance. She would like to return to  cardiofit using light weight and dumbbells. When I walk my low back hurts sometimes. I wear a weighted vest 12# every other walk which doesn't change the back pain. I'm trying to stay as healthy as I can be as long as I can. She fell a year ago February onto her hands and she still has ongoing hand pain and a little shoulder pain. Wants to work on balance.  PERTINENT HISTORY:  HTN, vertigo  PAIN:  Are you having pain? Yes: NPRS scale: 0/10 Pain location: low back Pain description: stiffness and ache Aggravating factors: walking or or prolonged sitting Relieving factors: heat if really bad but usually just resolves  PRECAUTIONS: Other: Osteoporosis in lumbar and radius  RED FLAGS: None   WEIGHT BEARING RESTRICTIONS: No  FALLS:  Has patient fallen in last 6 months? No  LIVING ENVIRONMENT: Lives with: lives with their spouse Lives in: House/apartment Stairs: Yes: External: 6 steps; can reach both Has following equipment at home: None  OCCUPATION: retired  PLOF: Independent and Leisure: exercises, works in the garden, cooking  PATIENT GOALS: get stronger  NEXT MD VISIT: none scheduled  OBJECTIVE:  Note: Objective measures were completed at Evaluation unless otherwise noted.  DIAGNOSTIC FINDINGS:  ASSESSMENT: The BMD measured at AP Spine L2-L3 is 0.868 g/cm2 with a T-score of -2.8. This patient is considered osteoporotic according to World Health Organization [facility]) criteria.   L1 & L4 were excluded due to degenerative changes. The quality of the exam is good.   Site Region Measured Date Measured Age YA BMD Significant CHANGE T-score AP Spine L2-L3 11/01/2023 66.4 -2.8 0.868 g/cm2   DualFemur Neck Left 11/01/2023 66.4 -0.5 0.963 g/cm2   DualFemur Total Mean 11/01/2023 66.4 0.7 1.102 g/cm2   Left Forearm Radius 33% 11/01/2023 66.4 -2.7 0.637 g/cm2  PATIENT SURVEYS:  The Patient-Specific Functional Scale  Initial:  I am going to ask you to identify up to 3  important activities that you are unable to do or are having difficulty with as a result of this problem.  Today are there any activities that you are unable to do or having difficulty with because of this?  (Patient shown scale and patient rated each activity)  Follow up: When you first came in you had difficulty performing these activities.  Today do you still have difficulty?  Patient-Specific activity scoring scheme (Point to one number):  0 1 2 3 4 5 6 7 8 9  10 Unable                                                                                                          Able to perform To perform                                                                                                    activity at the same Activity         Level as before  Injury or problem  Activity         Walking                                                                 Initial:        8               follow up:  2.        Sitting on the airplane                                                                          Initial:        5               follow up:  3.                                                                                  Initial:                       follow up:     COGNITION: Overall cognitive status: Within functional limits for tasks assessed     SENSATION: WFL  MUSCLE LENGTH: Tight HS and piriformis B R>L  POSTURE: No Significant postural limitations  PALPATION: B gluteals marked TTP  LUMBAR ROM: WNL  LOWER EXTREMITY ROM:   WNL B  LOWER EXTREMITY MMT:  5/5 except  MMT Right eval Left eval  Hip flexion    Hip extension 4 4+  Hip abduction    Hip adduction    Hip internal rotation    Hip external rotation    Knee flexion    Knee extension    Ankle dorsiflexion    Ankle plantarflexion    Ankle inversion    Ankle eversion     (Blank  rows = not tested)   FUNCTIONAL TESTS:  5 times sit to stand: 13.72 Dynamic Gait Index: 23/24 completed 03/01/24 MCTSIB: Condition 1: Avg of 3 trials: 30 sec, Condition 2: Avg of 3 trials: 30 sec, Condition 3: Avg of 3 trials: 30 sec, Condition 4: Avg of 3 trials: 25 sec, and Total Score: 115/120 03/01/24 SLS x 10 sec B Functional squat - good form - some increased trunk bend   Tandem stance 30 sec B 03/01/24     TREATMENT DATE:  03/08/24: Osteoporosis education; handout per patient instructions and link to You Tube Goodbye Osteoporosis emailed  Earline Glenn pose as pre-walk warmup Sit to stands 10#: 10x counter push ups:   10x Dead lift with  10   # to tall cone between ankles:   10x Lunge backward at the sink cue "like going to kneel down" 5x each side Standing core strengthening series holding pair of 8 pound dumbbells while marching sets of 5 reps each:  1) Farmers hold; 2) single at the shoulder hold; 3) single overhead press hold right/left  03/01/24 Nustep L5 x 5 min - PT present to discuss status Seated fig 4 2x30 sec B Balance assessment: DGI, MCTSIB, tandem stance Functional squat x 10, with 10# x 10, with 10# B hands x 10 Standing Biceps curl to [facility] press 5# ea hand 2 x 10 - cues to avoid swinging Plank 3 x max hold  TA contraction + alt march x 10 ea TA contraction + heel slide x 10 ea TA + sequential march x 5 B 90/90 + heel tap x 10 B   02/16/24  See pt ed and HEP   PATIENT EDUCATION:  Education details: PT eval findings, anticipated POC, initial HEP, and educated on do's and don'ts of OP, avoiding flexion and twisting activities, Do it right and prevent fractures, log roll, some ADL modifications reviewed and importance of neutral spine stressed.   Person educated: Patient Education method: Explanation, Demonstration, and Handouts Education  comprehension: verbalized understanding and returned demonstration  HOME EXERCISE PROGRAM: Access Code: [email or web address] URL: [email or web address] Date: 03/01/2024 Prepared by: Concha Deed  Exercises - Seated Piriformis Stretch with Trunk Bend  - 2 x daily - 7 x weekly - 1 sets - 3 reps - 30-60 sec  hold - Standard Plank  - 2 x daily - 7 x weekly - 1 sets - 5 reps - max hold - Supine Dead Bug with Leg Extension  - 1 x daily - 3 x weekly - 2 sets - 10 reps  Patient Education - Do it right & prevent fractures - Osteoporosis education - Posture and Body Mechanics  ASSESSMENT:  CLINICAL IMPRESSION: Therapist educating patient on current research on osteoporosis and types of exercises most beneficial.  Instruction given in hip hinging /dead lift technique needed for safe lifting capacity of household items and for posterior chain strengthening of lumbar and lower extremity musculature.  Verbal cues to keep the weight close to the body, hip and shoulders to rise together and for forward gaze needed for neutral spine alignment.       Eval: Patient is a 68 y.o. female who was seen today for physical therapy evaluation and treatment for strengthening due to her osteoporosis diagnosis. She also reports low back pain and balance issues. She demonstrates deficits in education regarding her dx, LE flexibility and strength. She reports balance issues, but denies falls in the past 6 months. She does report unsteadiness at times with ADLS. Her back pain primarily occurs with walking, but is also present after prolonged sitting mostly on airplanes, which she is on frequently. She will benefit from skilled PT to address these deficits.    OBJECTIVE IMPAIRMENTS: decreased activity tolerance, decreased balance, decreased knowledge of condition, decreased strength, increased muscle spasms, impaired flexibility, improper body mechanics, and pain.   ACTIVITY LIMITATIONS: transfers and locomotion  level  PARTICIPATION LIMITATIONS: not limited but painful  PERSONAL FACTORS: Age, Time since onset of injury/illness/exacerbation, and 1-2 comorbidities: DJD spine, unsteadiness are also affecting  patient's functional outcome.   REHAB POTENTIAL: Excellent  CLINICAL DECISION MAKING: Evolving/moderate complexity  EVALUATION COMPLEXITY: Low   GOALS: Goals reviewed with patient? Yes  SHORT TERM GOALS: Target date: 03/08/24  Balance assessed for impairments and goals set. Baseline: Goal status: MET  2.  Patient able to verbalize do's and don'ts of OP Baseline:  Goal status: INITIAL  3.  Ind with initial HEP Baseline:  Goal status: INITIAL   LONG TERM GOALS: Target date: 03/29/24  Ind with advanced HEP to prevent injury and maintain gains made in PT Baseline:  Goal status: INITIAL  2.  Improved PSFS by 2-3 points   Baseline:  Goal status: INITIAL  3.  Decreased pain in low back by 50% or more with walking and prolonged sitting. Baseline:  Goal status: INITIAL  4.  Improved MCTSIB to 120 showing improvement in balance Baseline: 1115 Goal status: INITIAL   PLAN:  PT FREQUENCY: 1x/week  PT DURATION: 6 weeks  PLANNED INTERVENTIONS: 97164- PT Re-evaluation, 97110-Therapeutic exercises, 97530- Therapeutic activity, 97112- Neuromuscular re-education, 97535- Self Care, 16109- Manual therapy, Patient/Family education, Balance training, Taping, Dry Needling, Joint mobilization, Spinal mobilization, Cryotherapy, and Moist heat.  PLAN FOR NEXT SESSION:  work on weightbearing functional exercises upper and lower; divers for balance and compliant surface activities.   Darien Eden, PT 03/08/24 6:57 PM Phone: [phone] Fax: [phone]

## 2024-03-14 NOTE — Therapy (Signed)
 OUTPATIENT PHYSICAL THERAPY THORACOLUMBAR TREATMENT   Patient Name: Gloria Daugherty MRN: [id number] DOB:12/12/1955, 68 y.o., female Today's Date: 03/14/2024  END OF SESSION:     Past Medical History:  Diagnosis Date   Hyperlipidemia    Hypertension    Menopause    Osteoporosis    Plantar fasciitis    Past Surgical History:  Procedure Laterality Date   NASAL SEPTUM SURGERY  1996   TUBAL LIGATION     Patient Active Problem List   Diagnosis Date Noted   Osteopenia  Dexa done 2016  12/17/2014   Osteoporosis 09/05/2014   Vaginal dryness, menopausal 09/05/2014   Dyspareunia 09/05/2014   Hyperlipidemia 09/05/2014    PCP: Espinoza, Alejandra, DO   REFERRING PROVIDER: Gordy Lauber, MD   REFERRING DIAG: M81.0 (ICD-10-CM) - Age-related osteoporosis without current pathological fracture   Rationale for Evaluation and Treatment: Rehabilitation  THERAPY DIAG:  No diagnosis found.  ONSET DATE: 2015  SUBJECTIVE:                                                                                                                                                                                           SUBJECTIVE STATEMENT: ***   Eval: Have had OP for a while and have been on meds. Saw endocrinologist and he says that the meds have not improved her OP much so he wants her to try PT. Patient walks and does cardio-dance. She would like to return to cardiofit using light weight and dumbbells. When I walk my low back hurts sometimes. I wear a weighted vest 12# every other walk which doesn't change the back pain. I'm trying to stay as healthy as I can be as long as I can. She fell a year ago February onto her hands and she still has ongoing hand pain and a little shoulder pain. Wants to work on balance.  PERTINENT HISTORY:   HTN, vertigo  PAIN:  Are you having pain? Yes: NPRS scale: 0/10 Pain location: low back Pain description: stiffness and ache Aggravating factors:  walking or or prolonged sitting Relieving factors: heat if really bad but usually just resolves  PRECAUTIONS: Other: Osteoporosis in lumbar and radius  RED FLAGS: None   WEIGHT BEARING RESTRICTIONS: No  FALLS:  Has patient fallen in last 6 months? No  LIVING ENVIRONMENT: Lives with: lives with their spouse Lives in: House/apartment Stairs: Yes: External: 6 steps; can reach both Has following equipment at home: None  OCCUPATION: retired  PLOF: Independent and Leisure: exercises, works in the garden, cooking  PATIENT GOALS: get stronger  NEXT MD VISIT: none scheduled  OBJECTIVE:  Note: Objective measures  were completed at Evaluation unless otherwise noted.  DIAGNOSTIC FINDINGS:  ASSESSMENT: The BMD measured at AP Spine L2-L3 is 0.868 g/cm2 with a T-score of -2.8. This patient is considered osteoporotic according to World Health Organization [facility]) criteria.   L1 & L4 were excluded due to degenerative changes. The quality of the exam is good.   Site Region Measured Date Measured Age YA BMD Significant CHANGE T-score AP Spine L2-L3 11/01/2023 66.4 -2.8 0.868 g/cm2   DualFemur Neck Left 11/01/2023 66.4 -0.5 0.963 g/cm2   DualFemur Total Mean 11/01/2023 66.4 0.7 1.102 g/cm2   Left Forearm Radius 33% 11/01/2023 66.4 -2.7 0.637 g/cm2  PATIENT SURVEYS:  The Patient-Specific Functional Scale  Initial:  I am going to ask you to identify up to 3 important activities that you are unable to do or are having difficulty with as a result of this problem.  Today are there any activities that you are unable to do or having difficulty with because of this?  (Patient shown scale and patient rated each activity)  Follow up: When you first came in you had difficulty performing these activities.  Today do you still have difficulty?  Patient-Specific activity scoring scheme (Point to one number):  0 1 2 3 4 5 6 7 8 9  10 Unable                                                                                                           Able to perform To perform                                                                                                    activity at the same Activity         Level as before                                                                                                                       Injury or problem  Activity         Walking  Initial:        8               follow up:  2.        Sitting on the airplane                                                                          Initial:        5               follow up:  3.                                                                                  Initial:                       follow up:     COGNITION: Overall cognitive status: Within functional limits for tasks assessed     SENSATION: WFL  MUSCLE LENGTH: Tight HS and piriformis B R>L  POSTURE: No Significant postural limitations  PALPATION: B gluteals marked TTP  LUMBAR ROM: WNL  LOWER EXTREMITY ROM:   WNL B  LOWER EXTREMITY MMT:  5/5 except  MMT Right eval Left eval  Hip flexion    Hip extension 4 4+  Hip abduction    Hip adduction    Hip internal rotation    Hip external rotation    Knee flexion    Knee extension    Ankle dorsiflexion    Ankle plantarflexion    Ankle inversion    Ankle eversion     (Blank rows = not tested)   FUNCTIONAL TESTS:  5 times sit to stand: 13.72 Dynamic Gait Index: 23/24 completed 03/01/24 MCTSIB: Condition 1: Avg of 3 trials: 30 sec, Condition 2: Avg of 3 trials: 30 sec, Condition 3: Avg of 3 trials: 30 sec, Condition 4: Avg of 3 trials: 25 sec, and Total Score: 115/120 03/01/24 SLS x 10 sec B Functional squat - good form - some increased trunk bend   Tandem stance 30 sec B 03/01/24     TREATMENT DATE:                                                                                                                                03/15/24: Earline Glenn pose as pre-walk warmup Sit to stands 10#:  10x counter push ups:   10x Dead lift with  10   # to tall cone between ankles:   10x Lunge backward at the sink cue "like going to kneel down" 5x each side Standing core strengthening series holding pair of 8 pound dumbbells while marching sets of 5 reps each:  1) Farmers hold; 2) single at the shoulder hold; 3) single overhead press hold right/left  03/08/24: Osteoporosis education; handout per patient instructions and link to You Tube Goodbye Osteoporosis emailed  Earline Glenn pose as pre-walk warmup Sit to stands 10#: 10x counter push ups:   10x Dead lift with  10   # to tall cone between ankles:   10x Lunge backward at the sink cue "like going to kneel down" 5x each side Standing core strengthening series holding pair of 8 pound dumbbells while marching sets of 5 reps each:  1) Farmers hold; 2) single at the shoulder hold; 3) single overhead press hold right/left  03/01/24 Nustep L5 x 5 min - PT present to discuss status Seated fig 4 2x30 sec B Balance assessment: DGI, MCTSIB, tandem stance Functional squat x 10, with 10# x 10, with 10# B hands x 10 Standing Biceps curl to [facility] press 5# ea hand 2 x 10 - cues to avoid swinging Plank 3 x max hold  TA contraction + alt march x 10 ea TA contraction + heel slide x 10 ea TA + sequential march x 5 B 90/90 + heel tap x 10 B   02/16/24  See pt ed and HEP   PATIENT EDUCATION:  Education details: PT eval findings, anticipated POC, initial HEP, and educated on do's and don'ts of OP, avoiding flexion and twisting activities, Do it right and prevent fractures, log roll, some ADL modifications reviewed and importance of neutral spine stressed.   Person educated: Patient Education method: Explanation, Demonstration, and Handouts Education comprehension: verbalized understanding and returned demonstration  HOME EXERCISE PROGRAM: Access Code: [email or web address] URL:  [email or web address] Date: 03/01/2024 Prepared by: Concha Deed  Exercises - Seated Piriformis Stretch with Trunk Bend  - 2 x daily - 7 x weekly - 1 sets - 3 reps - 30-60 sec  hold - Standard Plank  - 2 x daily - 7 x weekly - 1 sets - 5 reps - max hold - Supine Dead Bug with Leg Extension  - 1 x daily - 3 x weekly - 2 sets - 10 reps  Patient Education - Do it right & prevent fractures - Osteoporosis education - Posture and Body Mechanics  ASSESSMENT:  CLINICAL IMPRESSION: ***       Eval: Patient is a 68 y.o. female who was seen today for physical therapy evaluation and treatment for strengthening due to her osteoporosis diagnosis. She also reports low back pain and balance issues. She demonstrates deficits in education regarding her dx, LE flexibility and strength. She reports balance issues, but denies falls in the past 6 months. She does report unsteadiness at times with ADLS. Her back pain primarily occurs with walking, but is also present after prolonged sitting mostly on airplanes, which she is on frequently. She will benefit from skilled PT to address these deficits.    OBJECTIVE IMPAIRMENTS: decreased activity tolerance, decreased balance, decreased knowledge of condition, decreased strength, increased muscle spasms, impaired flexibility, improper body mechanics, and pain.   ACTIVITY LIMITATIONS: transfers and locomotion level  PARTICIPATION LIMITATIONS: not limited but painful  PERSONAL FACTORS: Age, Time since onset of injury/illness/exacerbation, and 1-2 comorbidities: DJD spine, unsteadiness  are also affecting patient's functional outcome.   REHAB POTENTIAL: Excellent  CLINICAL DECISION MAKING: Evolving/moderate complexity  EVALUATION COMPLEXITY: Low   GOALS: Goals reviewed with patient? Yes  SHORT TERM GOALS: Target date: 03/08/24  Balance assessed for impairments and goals set. Baseline: Goal status: MET  2.  Patient able to verbalize do's and don'ts  of OP Baseline:  Goal status: INITIAL  3.  Ind with initial HEP Baseline:  Goal status: INITIAL   LONG TERM GOALS: Target date: 03/29/24  Ind with advanced HEP to prevent injury and maintain gains made in PT Baseline:  Goal status: INITIAL  2.  Improved PSFS by 2-3 points   Baseline:  Goal status: INITIAL  3.  Decreased pain in low back by 50% or more with walking and prolonged sitting. Baseline:  Goal status: INITIAL  4.  Improved MCTSIB to 120 showing improvement in balance Baseline: 1115 Goal status: INITIAL   PLAN:  PT FREQUENCY: 1x/week  PT DURATION: 6 weeks  PLANNED INTERVENTIONS: 97164- PT Re-evaluation, 97110-Therapeutic exercises, 97530- Therapeutic activity, 97112- Neuromuscular re-education, 97535- Self Care, 16109- Manual therapy, Patient/Family education, Balance training, Taping, Dry Needling, Joint mobilization, Spinal mobilization, Cryotherapy, and Moist heat.  PLAN FOR NEXT SESSION:  work on weightbearing functional exercises upper and lower; divers for balance and compliant surface activities.   Jinx Mourning, PT  03/14/24 8:10 PM Phone: [phone] Fax: [phone]

## 2024-03-15 ENCOUNTER — Ambulatory Visit: Admitting: Physical Therapy

## 2024-03-15 ENCOUNTER — Encounter: Payer: Self-pay | Admitting: Physical Therapy

## 2024-03-15 DIAGNOSIS — R252 Cramp and spasm: Secondary | ICD-10-CM

## 2024-03-15 DIAGNOSIS — M5459 Other low back pain: Secondary | ICD-10-CM

## 2024-03-15 DIAGNOSIS — R29898 Other symptoms and signs involving the musculoskeletal system: Secondary | ICD-10-CM | POA: Diagnosis not present

## 2024-03-15 DIAGNOSIS — R2681 Unsteadiness on feet: Secondary | ICD-10-CM

## 2024-04-05 ENCOUNTER — Encounter: Admitting: Physical Therapy

## 2024-04-12 ENCOUNTER — Encounter: Admitting: Physical Therapy

## 2024-05-29 ENCOUNTER — Telehealth: Payer: Self-pay | Admitting: Nurse Practitioner

## 2024-05-29 NOTE — Telephone Encounter (Signed)
 Pt needed to reschedule appt due to schedule.

## 2024-05-30 ENCOUNTER — Other Ambulatory Visit: Payer: Medicare Other

## 2024-06-06 ENCOUNTER — Other Ambulatory Visit: Payer: Medicare Other

## 2024-06-06 ENCOUNTER — Other Ambulatory Visit

## 2024-06-06 ENCOUNTER — Ambulatory Visit: Payer: Medicare Other | Admitting: Nurse Practitioner

## 2024-06-09 ENCOUNTER — Ambulatory Visit: Admitting: Nurse Practitioner

## 2024-06-14 ENCOUNTER — Other Ambulatory Visit: Payer: Medicare Other

## 2024-06-28 ENCOUNTER — Inpatient Hospital Stay: Attending: Nurse Practitioner

## 2024-06-28 DIAGNOSIS — R7989 Other specified abnormal findings of blood chemistry: Secondary | ICD-10-CM

## 2024-06-28 DIAGNOSIS — R790 Abnormal level of blood mineral: Secondary | ICD-10-CM | POA: Diagnosis present

## 2024-06-28 LAB — CBC WITH DIFFERENTIAL (CANCER CENTER ONLY)
Abs Immature Granulocytes: 0.01 K/uL (ref 0.00–0.07)
Basophils Absolute: 0 K/uL (ref 0.0–0.1)
Basophils Relative: 1 %
Eosinophils Absolute: 0.1 K/uL (ref 0.0–0.5)
Eosinophils Relative: 1 %
HCT: 38.7 % (ref 36.0–46.0)
Hemoglobin: 13.5 g/dL (ref 12.0–15.0)
Immature Granulocytes: 0 %
Lymphocytes Relative: 39 %
Lymphs Abs: 2.2 K/uL (ref 0.7–4.0)
MCH: 32.3 pg (ref 26.0–34.0)
MCHC: 34.9 g/dL (ref 30.0–36.0)
MCV: 92.6 fL (ref 80.0–100.0)
Monocytes Absolute: 0.3 K/uL (ref 0.1–1.0)
Monocytes Relative: 6 %
Neutro Abs: 3 K/uL (ref 1.7–7.7)
Neutrophils Relative %: 53 %
Platelet Count: 180 K/uL (ref 150–400)
RBC: 4.18 MIL/uL (ref 3.87–5.11)
RDW: 13 % (ref 11.5–15.5)
WBC Count: 5.7 K/uL (ref 4.0–10.5)
nRBC: 0 % (ref 0.0–0.2)

## 2024-06-28 LAB — VITAMIN B12: Vitamin B-12: 1095 pg/mL — ABNORMAL HIGH (ref 180–914)

## 2024-06-30 ENCOUNTER — Encounter: Payer: Self-pay | Admitting: Nurse Practitioner

## 2024-06-30 ENCOUNTER — Inpatient Hospital Stay (HOSPITAL_BASED_OUTPATIENT_CLINIC_OR_DEPARTMENT_OTHER): Admitting: Nurse Practitioner

## 2024-06-30 VITALS — BP 127/66 | HR 73 | Temp 97.9°F | Resp 18

## 2024-06-30 DIAGNOSIS — R7989 Other specified abnormal findings of blood chemistry: Secondary | ICD-10-CM

## 2024-06-30 DIAGNOSIS — R790 Abnormal level of blood mineral: Secondary | ICD-10-CM | POA: Diagnosis not present

## 2024-06-30 NOTE — Progress Notes (Signed)
  [facility] OFFICE PROGRESS NOTE   Diagnosis: Elevated B12 level  INTERVAL HISTORY:   Gloria Daugherty returns as scheduled.  She feels well.  No interim illnesses or infections.  She has a good appetite.  No weight loss.  No fevers or sweats.  No bleeding.  She is intermittently fatigued.  She wonders if this could be related to amlodipine.  Objective:  Vital signs in last 24 hours:  Blood pressure 127/66, pulse 73, temperature 97.9 F (36.6 C), temperature source Temporal, resp. rate 18, SpO2 97%.    Resp: Lungs clear bilaterally. Cardio: Regular rate and rhythm. GI: No hepatosplenomegaly. Vascular: No leg edema.   Lab Results:  Lab Results  Component Value Date   WBC 5.7 06/28/2024   HGB 13.5 06/28/2024   HCT 38.7 06/28/2024   MCV 92.6 06/28/2024   PLT 180 06/28/2024   NEUTROABS 3.0 06/28/2024    Imaging:  No results found.  Medications: I have reviewed the patient's current medications.  Assessment/Plan: Elevated B12 level 03/22/2003 B12 greater than 2000 Multivitamin with B12 discontinued 04/14/2023 B12 greater than 1501 06/02/2023 B12 greater than 1501 08/19/2023 B12 1275 12/01/2023 B12 1033 06/30/2024 B12 1095 Hypertension Hypercholesterolemia Osteoporosis  Disposition: Gloria Daugherty appears well.  B12 level remains mildly elevated.  There is no clinical evidence for a myeloproliferative disorder or other hematologic condition.  The CBC is normal.  She is comfortable following up with her PCP.  We are available to see her in the future if needed.  Plan reviewed with Dr. Cloretta.  Olam Ned ANP/GNP-BC   06/30/2024  2:40 PM
